# Patient Record
Sex: Male | Born: 1965 | Race: White | Hispanic: Yes | Marital: Married | Smoking: Former smoker
Health system: Southern US, Community
[De-identification: ages and names within clinical notes are randomized; demographics above are authoritative.]

## PROBLEM LIST (undated history)

## (undated) DIAGNOSIS — R51 Headache: Secondary | ICD-10-CM

## (undated) DIAGNOSIS — R42 Dizziness and giddiness: Secondary | ICD-10-CM

---

## 2008-07-01 ENCOUNTER — Emergency Department (HOSPITAL_COMMUNITY): Admission: EM | Admit: 2008-07-01 | Discharge: 2008-07-01 | Payer: Self-pay | Admitting: Emergency Medicine

## 2008-11-08 ENCOUNTER — Emergency Department (HOSPITAL_COMMUNITY): Admission: EM | Admit: 2008-11-08 | Discharge: 2008-11-08 | Payer: Self-pay | Admitting: Emergency Medicine

## 2009-02-18 ENCOUNTER — Emergency Department (HOSPITAL_COMMUNITY): Admission: EM | Admit: 2009-02-18 | Discharge: 2009-02-18 | Payer: Self-pay | Admitting: Emergency Medicine

## 2009-02-19 ENCOUNTER — Encounter (INDEPENDENT_AMBULATORY_CARE_PROVIDER_SITE_OTHER): Payer: Self-pay | Admitting: *Deleted

## 2009-03-14 ENCOUNTER — Encounter (INDEPENDENT_AMBULATORY_CARE_PROVIDER_SITE_OTHER): Payer: Self-pay | Admitting: *Deleted

## 2009-03-27 ENCOUNTER — Encounter (INDEPENDENT_AMBULATORY_CARE_PROVIDER_SITE_OTHER): Payer: Self-pay | Admitting: *Deleted

## 2010-05-17 ENCOUNTER — Ambulatory Visit: Payer: Self-pay | Admitting: Vascular Surgery

## 2010-05-17 ENCOUNTER — Inpatient Hospital Stay (HOSPITAL_COMMUNITY): Admission: AC | Admit: 2010-05-17 | Discharge: 2010-05-20 | Payer: Self-pay | Source: Home / Self Care

## 2010-12-30 ENCOUNTER — Emergency Department (HOSPITAL_COMMUNITY): Payer: Self-pay

## 2010-12-30 ENCOUNTER — Emergency Department (HOSPITAL_COMMUNITY)
Admission: EM | Admit: 2010-12-30 | Discharge: 2010-12-30 | Disposition: A | Discharge status: Home / Self Care | Payer: Self-pay | Attending: Emergency Medicine | Admitting: Emergency Medicine

## 2010-12-30 DIAGNOSIS — R51 Headache: Secondary | ICD-10-CM | POA: Insufficient documentation

## 2010-12-30 DIAGNOSIS — H538 Other visual disturbances: Secondary | ICD-10-CM | POA: Insufficient documentation

## 2011-01-11 LAB — CBC
HCT: 35.5 % — ABNORMAL LOW (ref 39.0–52.0)
Hemoglobin: 12.2 g/dL — ABNORMAL LOW (ref 13.0–17.0)
Hemoglobin: 12.4 g/dL — ABNORMAL LOW (ref 13.0–17.0)
Hemoglobin: 12.6 g/dL — ABNORMAL LOW (ref 13.0–17.0)
Hemoglobin: 13.1 g/dL (ref 13.0–17.0)
Hemoglobin: 15.3 g/dL (ref 13.0–17.0)
MCH: 31.8 pg (ref 26.0–34.0)
MCH: 32 pg (ref 26.0–34.0)
MCH: 32 pg (ref 26.0–34.0)
MCH: 32.2 pg (ref 26.0–34.0)
MCHC: 34.6 g/dL (ref 30.0–36.0)
MCHC: 34.9 g/dL (ref 30.0–36.0)
MCHC: 34.9 g/dL (ref 30.0–36.0)
MCHC: 35 g/dL (ref 30.0–36.0)
MCHC: 35.1 g/dL (ref 30.0–36.0)
MCHC: 35.2 g/dL (ref 30.0–36.0)
MCV: 91.6 fL (ref 78.0–100.0)
MCV: 92 fL (ref 78.0–100.0)
Platelets: 89 10*3/uL — ABNORMAL LOW (ref 150–400)
Platelets: 95 10*3/uL — ABNORMAL LOW (ref 150–400)
RBC: 3.82 MIL/uL — ABNORMAL LOW (ref 4.22–5.81)
RBC: 3.88 MIL/uL — ABNORMAL LOW (ref 4.22–5.81)
RBC: 4.77 MIL/uL (ref 4.22–5.81)
RDW: 13.3 % (ref 11.5–15.5)
RDW: 13.5 % (ref 11.5–15.5)
RDW: 13.7 % (ref 11.5–15.5)
WBC: 14.1 10*3/uL — ABNORMAL HIGH (ref 4.0–10.5)

## 2011-01-11 LAB — PROTIME-INR
INR: 1.05 (ref 0.00–1.49)
INR: 1.05 (ref 0.00–1.49)
Prothrombin Time: 13.6 seconds (ref 11.6–15.2)

## 2011-01-11 LAB — POCT I-STAT 7, (LYTES, BLD GAS, ICA,H+H)
Acid-base deficit: 7 mmol/L — ABNORMAL HIGH (ref 0.0–2.0)
Bicarbonate: 19.6 meq/L — ABNORMAL LOW (ref 20.0–24.0)
HCT: 41 % (ref 39.0–52.0)
O2 Saturation: 100 %

## 2011-01-11 LAB — CARDIAC PANEL(CRET KIN+CKTOT+MB+TROPI)
CK, MB: 8.8 ng/mL (ref 0.3–4.0)
CK, MB: 9.3 ng/mL (ref 0.3–4.0)
Relative Index: 0.4 (ref 0.0–2.5)
Relative Index: 0.7 (ref 0.0–2.5)
Total CK: 1559 U/L — ABNORMAL HIGH (ref 7–232)

## 2011-01-11 LAB — BASIC METABOLIC PANEL
BUN: 14 mg/dL (ref 6–23)
CO2: 23 mEq/L (ref 19–32)
CO2: 24 mEq/L (ref 19–32)
Calcium: 7.6 mg/dL — ABNORMAL LOW (ref 8.4–10.5)
Chloride: 104 mEq/L (ref 96–112)
Creatinine, Ser: 0.79 mg/dL (ref 0.4–1.5)
GFR calc Af Amer: >60 mL/min (ref >60)
GFR calc non Af Amer: >60 mL/min (ref >60)
Glucose, Bld: 124 mg/dL — ABNORMAL HIGH (ref 70–99)
Glucose, Bld: 95 mg/dL (ref 70–99)
Potassium: 3.4 mEq/L — ABNORMAL LOW (ref 3.5–5.1)
Sodium: 134 mEq/L — ABNORMAL LOW (ref 135–145)

## 2011-01-11 LAB — TYPE AND SCREEN
ABO/RH(D): A POS
Antibody Screen: NEGATIVE

## 2011-01-11 LAB — COMPREHENSIVE METABOLIC PANEL
AST: 30 U/L (ref 0–37)
Alkaline Phosphatase: 53 U/L (ref 39–117)
BUN: 17 mg/dL (ref 6–23)
CO2: 17 mEq/L — ABNORMAL LOW (ref 19–32)
Calcium: 8.3 mg/dL — ABNORMAL LOW (ref 8.4–10.5)
Chloride: 113 mEq/L — ABNORMAL HIGH (ref 96–112)
Creatinine, Ser: 1.03 mg/dL (ref 0.4–1.5)
GFR calc Af Amer: >60 mL/min (ref >60)
GFR calc non Af Amer: >60 mL/min (ref >60)
Potassium: 3.5 mEq/L (ref 3.5–5.1)
Sodium: 138 mEq/L (ref 135–145)

## 2011-01-11 LAB — POCT I-STAT, CHEM 8
Calcium, Ion: 1.05 mmol/L — ABNORMAL LOW (ref 1.12–1.32)
Creatinine, Ser: 0.9 mg/dL (ref 0.4–1.5)
Glucose, Bld: 114 mg/dL — ABNORMAL HIGH (ref 70–99)
HCT: 45 % (ref 39.0–52.0)
Sodium: 141 meq/L (ref 135–145)

## 2011-01-11 LAB — APTT: aPTT: 28 seconds (ref 24–37)

## 2011-01-11 LAB — ABO/RH: ABO/RH(D): A POS

## 2011-01-11 LAB — GLUCOSE, CAPILLARY: Glucose-Capillary: 149 mg/dL — ABNORMAL HIGH (ref 70–99)

## 2011-02-05 LAB — COMPREHENSIVE METABOLIC PANEL
Albumin: 4.3 g/dL (ref 3.5–5.2)
Alkaline Phosphatase: 58 U/L (ref 39–117)
BUN: 15 mg/dL (ref 6–23)
Calcium: 9.2 mg/dL (ref 8.4–10.5)
Creatinine, Ser: 0.79 mg/dL (ref 0.4–1.5)
Glucose, Bld: 119 mg/dL — ABNORMAL HIGH (ref 70–99)
Potassium: 3.9 mEq/L (ref 3.5–5.1)
Total Protein: 7.5 g/dL (ref 6.0–8.3)

## 2011-02-05 LAB — CBC
HCT: 51.2 % (ref 39.0–52.0)
Hemoglobin: 17.8 g/dL — ABNORMAL HIGH (ref 13.0–17.0)
MCHC: 34.8 g/dL (ref 30.0–36.0)
MCV: 91.7 fL (ref 78.0–100.0)
Platelets: 121 10*3/uL — ABNORMAL LOW (ref 150–400)
RDW: 13 % (ref 11.5–15.5)

## 2011-02-05 LAB — DIFFERENTIAL
Basophils Relative: 1 % (ref 0–1)
Lymphocytes Relative: 29 % (ref 12–46)
Lymphs Abs: 2.1 10*3/uL (ref 0.7–4.0)
Monocytes Absolute: 0.6 10*3/uL (ref 0.1–1.0)
Monocytes Relative: 8 % (ref 3–12)
Neutro Abs: 4.4 10*3/uL (ref 1.7–7.7)
Neutrophils Relative %: 61 % (ref 43–77)

## 2011-02-05 LAB — URINE MICROSCOPIC-ADD ON

## 2011-02-05 LAB — URINALYSIS, ROUTINE W REFLEX MICROSCOPIC
Glucose, UA: NEGATIVE mg/dL
Leukocytes, UA: NEGATIVE
Protein, ur: NEGATIVE mg/dL
Specific Gravity, Urine: 1.01 (ref 1.005–1.030)
Urobilinogen, UA: 0.2 mg/dL (ref 0.0–1.0)

## 2011-02-05 LAB — AMYLASE: Amylase: 70 U/L (ref 27–131)

## 2011-02-10 LAB — CBC
Hemoglobin: 16.9 g/dL (ref 13.0–17.0)
MCHC: 34.2 g/dL (ref 30.0–36.0)
RBC: 5.47 MIL/uL (ref 4.22–5.81)
RDW: 13 % (ref 11.5–15.5)

## 2011-02-10 LAB — POCT CARDIAC MARKERS: Troponin i, poc: <0.05 ng/mL (ref 0.00–0.09)

## 2011-02-10 LAB — COMPREHENSIVE METABOLIC PANEL
ALT: 33 U/L (ref 0–53)
AST: 26 U/L (ref 0–37)
Alkaline Phosphatase: 50 U/L (ref 39–117)
Calcium: 9.2 mg/dL (ref 8.4–10.5)
GFR calc Af Amer: >60 mL/min (ref >60)
Potassium: 3.8 mEq/L (ref 3.5–5.1)
Sodium: 137 mEq/L (ref 135–145)
Total Protein: 7.1 g/dL (ref 6.0–8.3)

## 2011-02-10 LAB — DIFFERENTIAL
Basophils Relative: 0 % (ref 0–1)
Eosinophils Absolute: 0.1 10*3/uL (ref 0.0–0.7)
Eosinophils Relative: 2 % (ref 0–5)
Lymphs Abs: 2.2 10*3/uL (ref 0.7–4.0)
Monocytes Absolute: 0.6 10*3/uL (ref 0.1–1.0)
Monocytes Relative: 8 % (ref 3–12)

## 2011-10-16 ENCOUNTER — Encounter: Payer: Self-pay | Admitting: Emergency Medicine

## 2011-10-16 ENCOUNTER — Emergency Department (HOSPITAL_COMMUNITY): Payer: Self-pay

## 2011-10-16 ENCOUNTER — Emergency Department (HOSPITAL_COMMUNITY)
Admission: EM | Admit: 2011-10-16 | Discharge: 2011-10-16 | Disposition: A | Discharge status: Home / Self Care | Payer: Self-pay | Attending: Emergency Medicine | Admitting: Emergency Medicine

## 2011-10-16 DIAGNOSIS — Z87891 Personal history of nicotine dependence: Secondary | ICD-10-CM | POA: Insufficient documentation

## 2011-10-16 DIAGNOSIS — J111 Influenza due to unidentified influenza virus with other respiratory manifestations: Secondary | ICD-10-CM | POA: Insufficient documentation

## 2011-10-16 LAB — GLUCOSE, CAPILLARY: Glucose-Capillary: 110 mg/dL — ABNORMAL HIGH (ref 70–99)

## 2011-10-16 LAB — RAPID STREP SCREEN (MED CTR MEBANE ONLY): Streptococcus, Group A Screen (Direct): NEGATIVE

## 2011-10-16 MED ORDER — GUAIFENESIN-CODEINE 100-10 MG/5ML PO SYRP: ORAL_SOLUTION | ORAL | Status: DC

## 2011-10-16 MED ORDER — IBUPROFEN 100 MG/5ML PO SUSP
800.0000 mg | Freq: Once | ORAL | Status: DC
  Filled 2011-10-16: qty 40

## 2011-10-16 MED ORDER — IBUPROFEN 800 MG PO TABS
ORAL_TABLET | ORAL | Status: AC
  Administered 2011-10-16: 800 mg
  Filled 2011-10-16: qty 1

## 2011-10-16 NOTE — ED Notes (Signed)
Alert, Nad.  Body aches, fever.

## 2011-10-16 NOTE — ED Provider Notes (Signed)
History     CSN: [id number]  Arrival date & time 10/16/11  1723   First MD Initiated Contact with Patient 10/16/11 1754      Chief Complaint  Patient presents with  . Generalized Body Aches  . Fever    (Consider location/radiation/quality/duration/timing/severity/associated sxs/prior treatment) Patient is a 44 y.o. male presenting with fever. No language interpreter was used.  Fever Primary symptoms of the febrile illness include fever and cough. Primary symptoms do not include wheezing, nausea, vomiting, diarrhea or rash. The current episode started 3 to 5 days ago. This is a new problem. The problem has not changed since onset.Primary symptoms comment: sore throat    History reviewed. No pertinent past medical history.  History reviewed. No pertinent past surgical history.  History reviewed. No pertinent family history.  History  Substance Use Topics  . Smoking status: Former Smoker -- 0.0 packs/day    Types: Cigarettes    Quit date: 10/08/2011  . Smokeless tobacco: Not on file  . Alcohol Use: No      Review of Systems  Constitutional: Positive for fever and chills.  HENT: Positive for sore throat.   Respiratory: Positive for cough. Negative for wheezing.   Gastrointestinal: Negative for nausea, vomiting and diarrhea.  Skin: Negative for rash.  All other systems reviewed and are negative.    Allergies  Review of patient's allergies indicates no known allergies.  Home Medications  No current outpatient prescriptions on file.  BP 149/77  Pulse 69  Temp(Src) 99.4 F (37.4 C) (Oral)  Resp 18  Ht 5\' 3"  (1.6 m)  Wt 201 lb 1.6 oz (91.218 kg)  BMI 35.62 kg/m2  SpO2 100%  Physical Exam  Nursing note and vitals reviewed. Constitutional: He is oriented to person, place, and time. He appears well-developed and well-nourished.  HENT:  Head: Normocephalic and atraumatic. No trismus in the jaw.  Mouth/Throat: Uvula is midline and mucous membranes are normal.  No uvula swelling. Posterior oropharyngeal erythema present. No oropharyngeal exudate, posterior oropharyngeal edema or tonsillar abscesses.  Eyes: EOM are normal.  Neck: Normal range of motion. Neck supple. No JVD present. No tracheal deviation present. No thyromegaly present.  Cardiovascular: Normal rate, regular rhythm, normal heart sounds and intact distal pulses.  Exam reveals no gallop and no friction rub.   No murmur heard. Pulmonary/Chest: Effort normal and breath sounds normal. No accessory muscle usage or stridor. Not tachypneic. No respiratory distress. He has no decreased breath sounds. He has no wheezes. He has no rhonchi. He has no rales. He exhibits no tenderness.  Abdominal: Soft. He exhibits no distension. There is no tenderness.  Musculoskeletal: Normal range of motion.  Lymphadenopathy:    He has no cervical adenopathy.  Neurological: He is alert and oriented to person, place, and time.  Skin: Skin is warm and dry.  Psychiatric: He has a normal mood and affect. Judgment normal.    ED Course  Procedures (including critical care time)  Labs Reviewed  GLUCOSE, CAPILLARY - Abnormal; Notable for the following:    Glucose-Capillary 110 (*)    All other components within normal limits  RAPID STREP SCREEN   Dg Chest 2 View  10/16/2011  *RADIOLOGY REPORT*  Clinical Data: Cough, congestion, fever.  CHEST - 2 VIEW  Comparison: None  Findings: There is peribronchial thickening.  Low volumes with bibasilar atelectasis.  Heart is borderline in size.  No effusions or acute bony abnormality.  IMPRESSION: Low lung volumes, bronchitis and bibasilar atelectasis.  Original  Report Authenticated By: Cyndie Chime, M.D.     No diagnosis found.    MDM  Pt does not really fit criteria for tx of flu with tamiflu.  No strep or pneumonia evidence on testing.  Will tx flu symptomatically.   Medical screening examination/treatment/procedure(s) were performed by non-physician practitioner  and as supervising physician I was immediately available for consultation/collaboration. Osvaldo Human, M.D.      Worthy Rancher, PA 10/16/11 1943  Carleene Cooper III, MD 10/16/11 [phone]

## 2011-10-16 NOTE — ED Notes (Signed)
Patient c/o generalized aching and fevers since Monday. Patient reports taking Advil and over the counter medication with little relief.

## 2011-10-16 NOTE — ED Notes (Signed)
Patient requesting to have blood sugar checked.   ?

## 2011-10-17 ENCOUNTER — Emergency Department (HOSPITAL_COMMUNITY)
Admission: EM | Admit: 2011-10-17 | Discharge: 2011-10-17 | Disposition: A | Discharge status: Home / Self Care | Payer: Self-pay | Attending: Emergency Medicine | Admitting: Emergency Medicine

## 2011-10-17 ENCOUNTER — Encounter (HOSPITAL_COMMUNITY): Payer: Self-pay | Admitting: Emergency Medicine

## 2011-10-17 DIAGNOSIS — Z87891 Personal history of nicotine dependence: Secondary | ICD-10-CM | POA: Insufficient documentation

## 2011-10-17 DIAGNOSIS — R51 Headache: Secondary | ICD-10-CM | POA: Insufficient documentation

## 2011-10-17 DIAGNOSIS — K5289 Other specified noninfective gastroenteritis and colitis: Secondary | ICD-10-CM | POA: Insufficient documentation

## 2011-10-17 DIAGNOSIS — R059 Cough, unspecified: Secondary | ICD-10-CM | POA: Insufficient documentation

## 2011-10-17 DIAGNOSIS — R1110 Vomiting, unspecified: Secondary | ICD-10-CM

## 2011-10-17 DIAGNOSIS — R05 Cough: Secondary | ICD-10-CM | POA: Insufficient documentation

## 2011-10-17 DIAGNOSIS — K529 Noninfective gastroenteritis and colitis, unspecified: Secondary | ICD-10-CM

## 2011-10-17 DIAGNOSIS — R112 Nausea with vomiting, unspecified: Secondary | ICD-10-CM | POA: Insufficient documentation

## 2011-10-17 LAB — BASIC METABOLIC PANEL
Chloride: 101 mEq/L (ref 96–112)
GFR calc Af Amer: >90 mL/min (ref >90)
GFR calc non Af Amer: >90 mL/min (ref >90)
Glucose, Bld: 114 mg/dL — ABNORMAL HIGH (ref 70–99)
Potassium: 3.8 mEq/L (ref 3.5–5.1)
Sodium: 135 mEq/L (ref 135–145)

## 2011-10-17 LAB — DIFFERENTIAL
Eosinophils Absolute: 0.1 10*3/uL (ref 0.0–0.7)
Lymphocytes Relative: 17 % (ref 12–46)
Lymphs Abs: 0.8 10*3/uL (ref 0.7–4.0)
Neutro Abs: 3.2 10*3/uL (ref 1.7–7.7)
Neutrophils Relative %: 70 % (ref 43–77)

## 2011-10-17 LAB — CBC
MCH: 29.7 pg (ref 26.0–34.0)
Platelets: 91 10*3/uL — ABNORMAL LOW (ref 150–400)
RBC: 4.99 MIL/uL (ref 4.22–5.81)
WBC: 4.6 10*3/uL (ref 4.0–10.5)

## 2011-10-17 MED ORDER — ONDANSETRON HCL 4 MG/2ML IJ SOLN
4.0000 mg | Freq: Once | INTRAMUSCULAR | Status: AC
  Administered 2011-10-17: 4 mg via INTRAVENOUS
  Filled 2011-10-17: qty 2

## 2011-10-17 MED ORDER — SODIUM CHLORIDE 0.9 % IV SOLN
Freq: Once | INTRAVENOUS | Status: AC
  Administered 2011-10-17: 21:00:00 via INTRAVENOUS

## 2011-10-17 MED ORDER — PROMETHAZINE HCL 25 MG PO TABS: 25.0000 mg | ORAL_TABLET | Freq: Four times a day (QID) | ORAL | Status: AC | PRN

## 2011-10-17 MED ORDER — KETOROLAC TROMETHAMINE 30 MG/ML IJ SOLN
30.0000 mg | Freq: Once | INTRAMUSCULAR | Status: AC
  Administered 2011-10-17: 30 mg via INTRAVENOUS
  Filled 2011-10-17: qty 1

## 2011-10-17 NOTE — ED Provider Notes (Signed)
History   Scribed for Geoffery Lyons, MD, the patient was seen in room  . This chart was scribed by Ellie Lunch.    CSN: [id number]  Arrival date & time 10/17/11  4098   First MD Initiated Contact with Patient 10/17/11 2010      Chief Complaint  Patient presents with  . Headache  . Nausea  . Emesis    (Consider location/radiation/quality/duration/timing/severity/associated sxs/prior treatment) HPI Scott Byrd is a 45 y.o. male who presents to the Emergency Department complaining of emesis onset today. Pt states vomiting, cough and headache as associated symptoms. Pt was here last night with fever, cough, and congestion. Medication administered in hospital last night has shown some improvement. Pt last urinated ~30 mins ago. Pt denies abdominal pain, diarrhea and blood in stools, blood in emesis, or fever. No pertinent medical Hx.   History reviewed. No pertinent past medical history.  History reviewed. No pertinent past surgical history.  History reviewed. No pertinent family history.  History  Substance Use Topics  . Smoking status: Former Smoker -- 0.0 packs/day    Types: Cigarettes    Quit date: 10/08/2011  . Smokeless tobacco: Not on file  . Alcohol Use: No      Review of Systems 10 Systems reviewed and are negative for acute change except as noted in the HPI.  Allergies  Review of patient's allergies indicates no known allergies.  Home Medications   Current Outpatient Rx  Name Route Sig Dispense Refill  . GUAIFENESIN-CODEINE 100-10 MG/5ML PO SYRP  10 ml q 4-6 hrs prn cough.  (please label in Spanish) 240 mL 0  . IBUPROFEN 200 MG PO TABS Oral Take 600 mg by mouth as needed. For pain       BP 152/82  Pulse 64  Temp(Src) 98.5 F (36.9 C) (Oral)  Resp 18  Wt 210 lb (95.255 kg)  SpO2 98%  Physical Exam  Nursing note and vitals reviewed. Constitutional: He is oriented to person, place, and time. He appears well-developed and well-nourished. No  distress.  HENT:  Right Ear: External ear normal.  Left Ear: External ear normal.  Mouth/Throat: Oropharynx is clear and moist.  Eyes: Conjunctivae are normal. Pupils are equal, round, and reactive to light.  Neck: Neck supple. No JVD present.  Cardiovascular: Normal rate, regular rhythm and normal heart sounds.   Pulmonary/Chest: Effort normal and breath sounds normal.  Abdominal: Soft. There is no tenderness.  Musculoskeletal: Normal range of motion.  Neurological: He is alert and oriented to person, place, and time.  Skin: Skin is warm and dry.    ED Course  Procedures (including critical care time) DIAGNOSTIC STUDIES: Oxygen Saturation is 98% on RA, nml by my interpretation.    COORDINATION OF CARE:  Labs Reviewed  CBC  DIFFERENTIAL  BASIC METABOLIC PANEL   ED MEDICATIONS Medications  0.9 %  sodium chloride infusion   ketorolac (TORADOL) 30 MG/ML injection 30 mg   ondansetron (ZOFRAN) injection 4 mg     No diagnosis found.    MDM  Labs look fine.  Illness seems viral in nature.  Will treat with phenergan, time, fluids.  To return prn for any problems.  I personally performed the services described in this documentation, which was scribed in my presence. The recorded information has been reviewed and considered.         Geoffery Lyons, MD 10/17/11 2142

## 2011-10-17 NOTE — ED Notes (Signed)
Pt c/o headache, vomiting, and nausea since yesterday.

## 2011-11-11 NOTE — ED Notes (Signed)
11/11/2011 cheratussin ac syrup left in ed on 10/16/11 discarded.

## 2012-03-29 ENCOUNTER — Encounter (HOSPITAL_COMMUNITY): Payer: Self-pay | Admitting: *Deleted

## 2012-03-29 ENCOUNTER — Emergency Department (HOSPITAL_COMMUNITY)
Admission: EM | Admit: 2012-03-29 | Discharge: 2012-03-29 | Disposition: A | Discharge status: Home / Self Care | Payer: Self-pay | Attending: Emergency Medicine | Admitting: Emergency Medicine

## 2012-03-29 DIAGNOSIS — R300 Dysuria: Secondary | ICD-10-CM | POA: Insufficient documentation

## 2012-03-29 DIAGNOSIS — F17200 Nicotine dependence, unspecified, uncomplicated: Secondary | ICD-10-CM | POA: Insufficient documentation

## 2012-03-29 DIAGNOSIS — Z202 Contact with and (suspected) exposure to infections with a predominantly sexual mode of transmission: Secondary | ICD-10-CM

## 2012-03-29 LAB — URINALYSIS, ROUTINE W REFLEX MICROSCOPIC
Glucose, UA: NEGATIVE mg/dL
Leukocytes, UA: NEGATIVE
pH: 6 (ref 5.0–8.0)

## 2012-03-29 LAB — URINE MICROSCOPIC-ADD ON

## 2012-03-29 MED ORDER — AZITHROMYCIN 250 MG PO TABS
1000.0000 mg | ORAL_TABLET | Freq: Once | ORAL | Status: AC
  Administered 2012-03-29: 1000 mg via ORAL
  Filled 2012-03-29: qty 4

## 2012-03-29 MED ORDER — LIDOCAINE HCL (PF) 1 % IJ SOLN
INTRAMUSCULAR | Status: AC
  Administered 2012-03-29: 2.1 mL
  Filled 2012-03-29: qty 5

## 2012-03-29 MED ORDER — CEFTRIAXONE SODIUM 1 G IJ SOLR
500.0000 mg | Freq: Once | INTRAMUSCULAR | Status: AC
  Administered 2012-03-29: 500 mg via INTRAMUSCULAR
  Filled 2012-03-29: qty 10

## 2012-03-29 NOTE — ED Notes (Signed)
Pt with no reaction to injection. Discharge instructions reviewed. Pt verbalized understanding.

## 2012-03-29 NOTE — ED Notes (Signed)
Dysuria x 2 days,No NVD, no fever.

## 2012-03-29 NOTE — ED Provider Notes (Signed)
History     CSN: [id number]  Arrival date & time 03/29/12  2001   First MD Initiated Contact with Patient 03/29/12 2032      Chief Complaint  Patient presents with  . Dysuria    (Consider location/radiation/quality/duration/timing/severity/associated sxs/prior treatment) HPI Comments: Patient c/o burning with urination for 2 days. occasional penile discharge.  States he noticed symptoms after having unprotected intercourse.  He denies abd pain, fever, back pain or vomiting.   Patient is a 46 y.o. male presenting with dysuria. The history is provided by the patient.  Dysuria  This is a new problem. The current episode started 2 days ago. The problem occurs every urination. The problem has not changed since onset.The quality of the pain is described as burning. The pain is mild. There has been no fever. He is sexually active. There is no history of pyelonephritis. Associated symptoms include discharge. Pertinent negatives include no chills, no sweats, no nausea, no vomiting, no frequency, no hematuria, no hesitancy, no urgency and no flank pain. He has tried nothing for the symptoms. His past medical history does not include kidney stones or recurrent UTIs.    History reviewed. No pertinent past medical history.  History reviewed. No pertinent past surgical history.  History reviewed. No pertinent family history.  History  Substance Use Topics  . Smoking status: Current Everyday Smoker -- 0.0 packs/day    Types: Cigarettes    Last Attempt to Quit: 10/08/2011  . Smokeless tobacco: Not on file  . Alcohol Use: Yes      Review of Systems  Constitutional: Negative for fever and chills.  HENT: Negative for neck pain and neck stiffness.   Respiratory: Negative for chest tightness and shortness of breath.   Gastrointestinal: Negative for nausea, vomiting and abdominal pain.  Genitourinary: Positive for dysuria and discharge. Negative for hesitancy, urgency, frequency, hematuria,  flank pain, decreased urine volume, penile swelling, scrotal swelling, difficulty urinating and testicular pain.  Musculoskeletal: Negative for back pain.  Skin: Negative for color change and wound.  All other systems reviewed and are negative.    Allergies  Review of patient's allergies indicates no known allergies.  Home Medications   Current Outpatient Rx  Name Route Sig Dispense Refill  . AMPICILLIN 500 MG PO CAPS Oral Take 500 mg by mouth every 6 (six) hours.    . IBUPROFEN 200 MG PO TABS Oral Take 600 mg by mouth as needed. For pain     . PHENAZOPYRIDINE HCL 97.5 MG PO TABS Oral Take 1 tablet by mouth 3 (three) times daily as needed.      BP 150/84  Pulse 72  Temp(Src) 98.2 F (36.8 C) (Oral)  Resp 20  Ht 5\' 6"  (1.676 m)  Wt 198 lb (89.812 kg)  BMI 31.96 kg/m2  SpO2 97%  Physical Exam  Nursing note and vitals reviewed. Constitutional: He is oriented to person, place, and time. He appears well-developed and well-nourished. No distress.  HENT:  Head: Normocephalic and atraumatic.  Mouth/Throat: Oropharynx is clear and moist.  Neck: Normal range of motion. Neck supple.  Cardiovascular: Normal rate, regular rhythm, normal heart sounds and intact distal pulses.   No murmur heard. Pulmonary/Chest: Effort normal and breath sounds normal. No respiratory distress. He exhibits no tenderness.  Abdominal: Soft. He exhibits no distension. There is no tenderness. There is no rebound.  Genitourinary: Cremasteric reflex is present. Uncircumcised. No phimosis, paraphimosis, hypospadias, penile erythema or penile tenderness. Discharge found.  Musculoskeletal: Normal range of motion.  He exhibits no edema.  Lymphadenopathy:    He has no cervical adenopathy.  Neurological: He is alert and oriented to person, place, and time. He exhibits normal muscle tone. Coordination normal.  Skin: Skin is warm and dry.    ED Course  Procedures (including critical care time)  Results for orders  placed during the hospital encounter of 03/29/12  URINALYSIS, ROUTINE W REFLEX MICROSCOPIC      Component Value Range   Color, Urine AMBER (*) YELLOW    APPearance CLEAR  CLEAR    Specific Gravity, Urine 1.010  1.005 - 1.030    pH 6.0  5.0 - 8.0    Glucose, UA NEGATIVE  NEGATIVE (mg/dL)   Hgb urine dipstick TRACE (*) NEGATIVE    Bilirubin Urine NEGATIVE  NEGATIVE    Ketones, ur NEGATIVE  NEGATIVE (mg/dL)   Protein, ur NEGATIVE  NEGATIVE (mg/dL)   Urobilinogen, UA 0.2  0.0 - 1.0 (mg/dL)   Nitrite NEGATIVE  NEGATIVE    Leukocytes, UA NEGATIVE  NEGATIVE   URINE MICROSCOPIC-ADD ON      Component Value Range   Squamous Epithelial / LPF RARE  RARE    WBC, UA 0-2  <3 (WBC/hpf)   RBC / HPF 0-2  <3 (RBC/hpf)   Bacteria, UA RARE  RARE       GC and Chlamydia cultures are pending   MDM     Patient is alert, vital signs are stable. Abdomen is nontender Reports burning with urination and after episode of unprotected intercourse. I will treat with Rocephin and Zithromax.  Patient / Family / Caregiver understand and agree with initial ED impression and plan with expectations set for ED visit. Pt stable in ED with no significant deterioration in condition. Pt feels improved after observation and/or treatment in ED.    The patient appears reasonably screened and/or stabilized for discharge and I doubt any other medical condition or other [facility] requiring further screening, evaluation, or treatment in the ED at this time prior to discharge.         Haruka Kowaleski L. Grace Haggart, [place] 04/02/12 1715

## 2012-03-31 LAB — GC/CHLAMYDIA PROBE AMP, GENITAL: GC Probe Amp, Genital: NEGATIVE

## 2012-04-02 NOTE — ED Provider Notes (Signed)
Medical screening examination/treatment/procedure(s) were performed by non-physician practitioner and as supervising physician I was immediately available for consultation/collaboration.   Glynn Octave, MD 04/02/12 [phone]

## 2012-06-16 ENCOUNTER — Emergency Department (HOSPITAL_COMMUNITY)
Admission: EM | Admit: 2012-06-16 | Discharge: 2012-06-16 | Disposition: A | Discharge status: Home / Self Care | Payer: Self-pay | Attending: Emergency Medicine | Admitting: Emergency Medicine

## 2012-06-16 ENCOUNTER — Encounter (HOSPITAL_COMMUNITY): Payer: Self-pay | Admitting: *Deleted

## 2012-06-16 ENCOUNTER — Emergency Department (HOSPITAL_COMMUNITY): Payer: Self-pay

## 2012-06-16 DIAGNOSIS — Z79899 Other long term (current) drug therapy: Secondary | ICD-10-CM | POA: Insufficient documentation

## 2012-06-16 DIAGNOSIS — F17200 Nicotine dependence, unspecified, uncomplicated: Secondary | ICD-10-CM | POA: Insufficient documentation

## 2012-06-16 DIAGNOSIS — H81399 Other peripheral vertigo, unspecified ear: Secondary | ICD-10-CM

## 2012-06-16 DIAGNOSIS — H538 Other visual disturbances: Secondary | ICD-10-CM | POA: Insufficient documentation

## 2012-06-16 DIAGNOSIS — R42 Dizziness and giddiness: Secondary | ICD-10-CM | POA: Insufficient documentation

## 2012-06-16 DIAGNOSIS — E119 Type 2 diabetes mellitus without complications: Secondary | ICD-10-CM | POA: Insufficient documentation

## 2012-06-16 HISTORY — DX: Headache: R51

## 2012-06-16 HISTORY — DX: Dizziness and giddiness: R42

## 2012-06-16 LAB — POCT I-STAT, CHEM 8
BUN: 24 mg/dL — ABNORMAL HIGH (ref 6–23)
Calcium, Ion: 1.21 mmol/L (ref 1.12–1.23)
Creatinine, Ser: 0.9 mg/dL (ref 0.50–1.35)
Sodium: 142 mEq/L (ref 135–145)
TCO2: 24 mmol/L (ref 0–100)

## 2012-06-16 MED ORDER — MECLIZINE HCL 12.5 MG PO TABS
50.0000 mg | ORAL_TABLET | Freq: Once | ORAL | Status: AC
  Administered 2012-06-16: 50 mg via ORAL
  Filled 2012-06-16: qty 4

## 2012-06-16 MED ORDER — MECLIZINE HCL 25 MG PO TABS: 25.0000 mg | ORAL_TABLET | Freq: Three times a day (TID) | ORAL | Status: AC | PRN

## 2012-06-16 NOTE — ED Provider Notes (Signed)
History     CSN: [id number]  Arrival date & time 06/16/12  1728   First MD Initiated Contact with Patient 06/16/12 1848      Chief Complaint  Patient presents with  . Blurred Vision     HPI Pt was seen at 1900.  Per pt, c/o gradual onset and persistence of multiple intermittent episodes of "dizziness" since this morning.  Pt describes his dizziness as "it feels like I'm drunk."  Symptoms worsen with movement of his body, improve with rest/laying still.  Have been associated with "blurry" vision.  Endorses he has a hx of these recurrent symptoms for the past several years, and cannot recall the name of the medicine he was taking.  Denies CP/palpitations, no SOB/cough, no abd pain, no N/V/D, no fevers, no neck pain.  Denies headache, no eye pain, no facial droop, no slurred speech, no dysphagia, no tingling/numbness in extremities, no focal motor weakness.      Past Medical History  Diagnosis Date  . Diabetes mellitus   . Headache   . Vertigo     History reviewed. No pertinent past surgical history.   History  Substance Use Topics  . Smoking status: Current Everyday Smoker -- 0.0 packs/day    Types: Cigarettes    Last Attempt to Quit: 10/08/2011  . Smokeless tobacco: Not on file  . Alcohol Use: Yes      Review of Systems ROS: Statement: All systems negative except as marked or noted in the HPI; Constitutional: Negative for fever and chills. ; ; Eyes: Negative for eye pain, redness and discharge. ; ; ENMT: Negative for ear pain, hoarseness, nasal congestion, sinus pressure and sore throat. ; ; Cardiovascular: Negative for chest pain, palpitations, diaphoresis, dyspnea and peripheral edema. ; ; Respiratory: Negative for cough, wheezing and stridor. ; ; Gastrointestinal: Negative for nausea, vomiting, diarrhea, abdominal pain, blood in stool, hematemesis, jaundice and rectal bleeding. . ; ; Genitourinary: Negative for dysuria, flank pain and hematuria. ; ; Musculoskeletal:  Negative for back pain and neck pain. Negative for swelling and trauma.; ; Skin: Negative for pruritus, rash, abrasions, blisters, bruising and skin lesion.; ; Neuro: +vertigo. Negative for headache, lightheadedness and neck stiffness. Negative for weakness, altered level of consciousness , altered mental status, extremity weakness, paresthesias, involuntary movement, seizure and syncope.       Allergies  Review of patient's allergies indicates no known allergies.  Home Medications   Current Outpatient Rx  Name Route Sig Dispense Refill  . AMPICILLIN 500 MG PO CAPS Oral Take 500 mg by mouth every 6 (six) hours.    . IBUPROFEN 200 MG PO TABS Oral Take 600 mg by mouth as needed. For pain     . PHENAZOPYRIDINE HCL 97.5 MG PO TABS Oral Take 1 tablet by mouth 3 (three) times daily as needed.      BP 149/83  Pulse 60  Temp 98.3 F (36.8 C) (Oral)  Resp 20  Ht 5\' 5"  (1.651 m)  Wt 164 lb (74.39 kg)  BMI 27.29 kg/m2  SpO2 100%  Physical Exam 1905: Physical examination:  Nursing notes reviewed; Vital signs and O2 SAT reviewed;  Constitutional: Well developed, Well nourished, Well hydrated, In no acute distress; Head:  Normocephalic, atraumatic; Eyes: EOMI, PERRL, No scleral icterus; ENMT: TM's clear bilat. +edemetous nasal turbinates bilat with clear rhinorrhea. Mouth and pharynx normal, Mucous membranes moist; Neck: Supple, Full range of motion, No lymphadenopathy; Cardiovascular: Regular rate and rhythm, No murmur, rub, or gallop; Respiratory: Breath  sounds clear & equal bilaterally, No rales, rhonchi, wheezes.  Speaking full sentences with ease, Normal respiratory effort/excursion; Chest: Nontender, Movement normal; Abdomen: Soft, Nontender, Nondistended, Normal bowel sounds; Genitourinary: No CVA tenderness; Extremities: Pulses normal, No tenderness, No edema, No calf edema or asymmetry.; Neuro: AA&Ox3, Major CN grossly intact.  Strength 5/5 equal bilat UE's and LE's.  DTR 2/4 equal bilat UE's  and LE's.  No gross sensory deficits.  Normal cerebellar testing bilat UE's (finger-nose) and LE's (heel-shin).  Speech clear.  No facial droop.  +left horizontal gaze fatigable nystagmus which reproduces pt's symptoms.;; Skin: Color normal, Warm, Dry, no rash.     ED Course  Procedures   MDM  MDM Reviewed: previous chart, nursing note and vitals Interpretation: labs and CT scan   Results for orders placed during the hospital encounter of 06/16/12  GLUCOSE, CAPILLARY      Component Value Range   Glucose-Capillary 96  70 - 99 mg/dL  POCT I-STAT, CHEM 8      Component Value Range   Sodium 142  135 - 145 mEq/L   Potassium 3.6  3.5 - 5.1 mEq/L   Chloride 107  96 - 112 mEq/L   BUN 24 (*) 6 - 23 mg/dL   Creatinine, Ser 9.14  0.50 - 1.35 mg/dL   Glucose, Bld 782 (*) 70 - 99 mg/dL   Calcium, Ion 9.56  2.13 - 1.23 mmol/L   TCO2 24  0 - 100 mmol/L   Hemoglobin 16.0  13.0 - 17.0 g/dL   HCT 08.6  57.8 - 46.9 %   Ct Head Wo Contrast 06/16/2012  *RADIOLOGY REPORT*  Clinical Data: Headache  CT HEAD WITHOUT CONTRAST  Technique:  Contiguous axial images were obtained from the base of the skull through the vertex without contrast.  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  Ventricles are normal in configuration and size. No midline shift. No acute hemorrhage, acute infarction, or mass lesion is identified.  No midline shift.  No ventriculomegaly.  No skull fracture.  Mild ethmoid and right maxillary mucoperiosteal thickening.  IMPRESSION: No acute intracranial finding.  Mild sinusitis.   Original Report Authenticated By: Harrel Lemon, M.D.      2015:  Pt states he feels "better" after meds and wants to go home now.  Neuro exam unchanged. Has been using his handheld electronic device without difficulty and watching TV while in the ED.  VS 20/20 OU, 20/20 OS, 20/20, OD.  Not orthostatic.  Dx testing d/w pt.  Questions answered.  Verb  understanding, agreeable to d/c home with outpt f/u.         Laray Anger, DO 06/17/12 2055

## 2012-06-16 NOTE — ED Notes (Signed)
Pt c/o blurred vision and dizziness since this am. Pt also c/o being stressed. Pt able to smile, squint eyes and stick out tongue with symmetry.

## 2012-06-16 NOTE — ED Notes (Signed)
Instructions/prescriptions reviewed and f/u information provided. Verbalizes understanding.  A&ox4; in no distress.  Denies dizziness, blurred vision.

## 2012-10-28 ENCOUNTER — Encounter (HOSPITAL_COMMUNITY): Payer: Self-pay | Admitting: *Deleted

## 2012-10-28 ENCOUNTER — Emergency Department (HOSPITAL_COMMUNITY)
Admission: EM | Admit: 2012-10-28 | Discharge: 2012-10-28 | Disposition: A | Discharge status: Home / Self Care | Payer: Self-pay | Attending: Emergency Medicine | Admitting: Emergency Medicine

## 2012-10-28 DIAGNOSIS — R300 Dysuria: Secondary | ICD-10-CM | POA: Insufficient documentation

## 2012-10-28 DIAGNOSIS — F17200 Nicotine dependence, unspecified, uncomplicated: Secondary | ICD-10-CM | POA: Insufficient documentation

## 2012-10-28 DIAGNOSIS — E119 Type 2 diabetes mellitus without complications: Secondary | ICD-10-CM | POA: Insufficient documentation

## 2012-10-28 DIAGNOSIS — Z8679 Personal history of other diseases of the circulatory system: Secondary | ICD-10-CM | POA: Insufficient documentation

## 2012-10-28 LAB — URINALYSIS, ROUTINE W REFLEX MICROSCOPIC
Glucose, UA: NEGATIVE mg/dL
Protein, ur: NEGATIVE mg/dL
pH: 6 (ref 5.0–8.0)

## 2012-10-28 LAB — URINE MICROSCOPIC-ADD ON

## 2012-10-28 LAB — GLUCOSE, CAPILLARY: Glucose-Capillary: 84 mg/dL (ref 70–99)

## 2012-10-28 MED ORDER — CIPROFLOXACIN HCL 500 MG PO TABS: 500.0000 mg | ORAL_TABLET | Freq: Two times a day (BID) | ORAL | Status: DC

## 2012-10-28 NOTE — ED Provider Notes (Signed)
History     CSN: [id number]  Arrival date & time 10/28/12  1716   First MD Initiated Contact with Patient 10/28/12 1847      Chief Complaint  Patient presents with  . Dysuria    (Consider location/radiation/quality/duration/timing/severity/associated sxs/prior treatment) Patient is a 47 y.o. male presenting with dysuria. The history is provided by the patient (the pt complains of dysuria).  Dysuria  This is a new problem. The current episode started more than 2 days ago. The problem occurs every urination. The problem has not changed since onset.The quality of the pain is described as burning. The pain is at a severity of 4/10. The pain is mild. There has been no fever. Pertinent negatives include no frequency and no hematuria.    Past Medical History  Diagnosis Date  . Diabetes mellitus   . Headache   . Vertigo     History reviewed. No pertinent past surgical history.  History reviewed. No pertinent family history.  History  Substance Use Topics  . Smoking status: Current Every Day Smoker -- 0.0 packs/day    Types: Cigarettes    Last Attempt to Quit: 10/08/2011  . Smokeless tobacco: Not on file  . Alcohol Use: Yes      Review of Systems  Constitutional: Negative for fatigue.  HENT: Negative for congestion, sinus pressure and ear discharge.   Eyes: Negative for discharge.  Respiratory: Negative for cough.   Cardiovascular: Negative for chest pain.  Gastrointestinal: Negative for abdominal pain and diarrhea.  Genitourinary: Positive for dysuria. Negative for frequency and hematuria.  Musculoskeletal: Negative for back pain.  Skin: Negative for rash.  Neurological: Negative for seizures and headaches.  Hematological: Negative.   Psychiatric/Behavioral: Negative for hallucinations.    Allergies  Review of patient's allergies indicates no known allergies.  Home Medications   Current Outpatient Rx  Name  Route  Sig  Dispense  Refill  . IBUPROFEN 200 MG PO  TABS   Oral   Take 600 mg by mouth as needed. For pain          . CIPROFLOXACIN HCL 500 MG PO TABS   Oral   Take 1 tablet (500 mg total) by mouth 2 (two) times daily.   28 tablet   0     BP 143/82  Pulse 68  Temp 98.1 F (36.7 C) (Oral)  Resp 21  Ht 5\' 2"  (1.575 m)  Wt 202 lb (91.627 kg)  BMI 36.95 kg/m2  SpO2 100%  Physical Exam  Constitutional: He is oriented to person, place, and time. He appears well-developed.  HENT:  Head: Normocephalic and atraumatic.  Eyes: Conjunctivae normal and EOM are normal. No scleral icterus.  Neck: Neck supple. No thyromegaly present.  Cardiovascular: Normal rate and regular rhythm.  Exam reveals no gallop and no friction rub.   No murmur heard. Pulmonary/Chest: No stridor. He has no wheezes. He has no rales. He exhibits no tenderness.  Abdominal: He exhibits no distension. There is no tenderness. There is no rebound.  Genitourinary: Penis normal.  Musculoskeletal: Normal range of motion. He exhibits no edema.  Lymphadenopathy:    He has no cervical adenopathy.  Neurological: He is oriented to person, place, and time. Coordination normal.  Skin: No rash noted. No erythema.  Psychiatric: He has a normal mood and affect. His behavior is normal.    ED Course  Procedures (including critical care time)  Labs Reviewed  URINALYSIS, ROUTINE W REFLEX MICROSCOPIC - Abnormal; Notable for the following:  Hgb urine dipstick TRACE (*)     All other components within normal limits  URINE MICROSCOPIC-ADD ON  GLUCOSE, CAPILLARY  GC/CHLAMYDIA PROBE AMP   No results found.   1. Dysuria       MDM          Benny Lennert, MD 10/28/12 2009

## 2012-10-28 NOTE — ED Notes (Signed)
Dysuria for 4 days No vomiting , no diarrhea,  Alert,

## 2012-10-28 NOTE — ED Notes (Signed)
Pt requesting to have his BS checked, thinks that may be his problem.

## 2012-10-28 NOTE — ED Notes (Signed)
POC CBG Result: 84

## 2012-10-30 LAB — GC/CHLAMYDIA PROBE AMP
CT Probe RNA: NEGATIVE
GC Probe RNA: NEGATIVE

## 2012-11-06 ENCOUNTER — Emergency Department (HOSPITAL_COMMUNITY)
Admission: EM | Admit: 2012-11-06 | Discharge: 2012-11-06 | Disposition: A | Discharge status: Home / Self Care | Payer: Self-pay | Attending: Emergency Medicine | Admitting: Emergency Medicine

## 2012-11-06 ENCOUNTER — Encounter (HOSPITAL_COMMUNITY): Payer: Self-pay

## 2012-11-06 DIAGNOSIS — R300 Dysuria: Secondary | ICD-10-CM | POA: Insufficient documentation

## 2012-11-06 DIAGNOSIS — E119 Type 2 diabetes mellitus without complications: Secondary | ICD-10-CM | POA: Insufficient documentation

## 2012-11-06 DIAGNOSIS — F17200 Nicotine dependence, unspecified, uncomplicated: Secondary | ICD-10-CM | POA: Insufficient documentation

## 2012-11-06 DIAGNOSIS — Z046 Encounter for general psychiatric examination, requested by authority: Secondary | ICD-10-CM | POA: Insufficient documentation

## 2012-11-06 LAB — CBC WITH DIFFERENTIAL/PLATELET
Basophils Absolute: 0 10*3/uL (ref 0.0–0.1)
Basophils Relative: 1 % (ref 0–1)
Eosinophils Absolute: 0.2 10*3/uL (ref 0.0–0.7)
Eosinophils Relative: 3 % (ref 0–5)
Lymphs Abs: 2.2 10*3/uL (ref 0.7–4.0)
MCH: 31 pg (ref 26.0–34.0)
MCHC: 34.5 g/dL (ref 30.0–36.0)
MCV: 89.9 fL (ref 78.0–100.0)
Platelets: 144 10*3/uL — ABNORMAL LOW (ref 150–400)
RDW: 13 % (ref 11.5–15.5)

## 2012-11-06 LAB — BASIC METABOLIC PANEL
Calcium: 9.7 mg/dL (ref 8.4–10.5)
GFR calc non Af Amer: >90 mL/min (ref >90)
Glucose, Bld: 105 mg/dL — ABNORMAL HIGH (ref 70–99)
Sodium: 134 mEq/L — ABNORMAL LOW (ref 135–145)

## 2012-11-06 LAB — URINALYSIS, ROUTINE W REFLEX MICROSCOPIC
Glucose, UA: NEGATIVE mg/dL
Nitrite: NEGATIVE
Specific Gravity, Urine: <1.005 — ABNORMAL LOW (ref 1.005–1.030)
pH: 6 (ref 5.0–8.0)

## 2012-11-06 LAB — RAPID URINE DRUG SCREEN, HOSP PERFORMED: Benzodiazepines: NOT DETECTED

## 2012-11-06 MED ORDER — AZITHROMYCIN 250 MG PO TABS
1000.0000 mg | ORAL_TABLET | Freq: Once | ORAL | Status: AC
  Administered 2012-11-06: 1000 mg via ORAL
  Filled 2012-11-06: qty 4

## 2012-11-06 NOTE — ED Provider Notes (Signed)
History     CSN: [id number]  Arrival date & time 11/06/12  1127   First MD Initiated Contact with Patient 11/06/12 1258      Chief Complaint  Patient presents with  . Dysuria  . V70.1    (Consider location/radiation/quality/duration/timing/severity/associated sxs/prior treatment) Patient is a 47 y.o. male presenting with dysuria. The history is provided by the patient (mother complains of dysuria). No language interpreter was used.  Dysuria  This is a new problem. The current episode started 6 to 12 hours ago. The problem occurs every urination. The problem has not changed since onset.The quality of the pain is described as burning. The pain is at a severity of 3/10. The pain is mild. There has been no fever. The fever has been present for less than 1 day. Pertinent negatives include no chills, no frequency and no hematuria. He has tried nothing for the symptoms. His past medical history does not include kidney stones.    Past Medical History  Diagnosis Date  . Diabetes mellitus   . Headache   . Vertigo     History reviewed. No pertinent past surgical history.  No family history on file.  History  Substance Use Topics  . Smoking status: Current Every Day Smoker -- 0.0 packs/day    Types: Cigarettes    Last Attempt to Quit: 10/08/2011  . Smokeless tobacco: Not on file  . Alcohol Use: Yes      Review of Systems  Constitutional: Negative for chills and fatigue.  HENT: Negative for congestion, sinus pressure and ear discharge.   Eyes: Negative for discharge.  Respiratory: Negative for cough.   Cardiovascular: Negative for chest pain.  Gastrointestinal: Negative for abdominal pain and diarrhea.  Genitourinary: Positive for dysuria. Negative for frequency and hematuria.  Musculoskeletal: Negative for back pain.  Skin: Negative for rash.  Neurological: Negative for seizures and headaches.  Hematological: Negative.   Psychiatric/Behavioral: Negative for hallucinations.      Allergies  Review of patient's allergies indicates no known allergies.  Home Medications   Current Outpatient Rx  Name  Route  Sig  Dispense  Refill  . CIPROFLOXACIN HCL 500 MG PO TABS   Oral   Take 1 tablet (500 mg total) by mouth 2 (two) times daily.   28 tablet   0   . IBUPROFEN 200 MG PO TABS   Oral   Take 600 mg by mouth as needed. For pain            BP 151/80  Pulse 65  Temp 98 F (36.7 C) (Oral)  SpO2 98%  Physical Exam  Constitutional: He is oriented to person, place, and time. He appears well-developed.  HENT:  Head: Normocephalic and atraumatic.  Eyes: Conjunctivae normal and EOM are normal. No scleral icterus.  Neck: Neck supple. No thyromegaly present.  Cardiovascular: Normal rate and regular rhythm.  Exam reveals no gallop and no friction rub.   No murmur heard. Pulmonary/Chest: No stridor. He has no wheezes. He has no rales. He exhibits no tenderness.  Abdominal: He exhibits no distension. There is no tenderness. There is no rebound.  Musculoskeletal: Normal range of motion. He exhibits no edema.  Lymphadenopathy:    He has no cervical adenopathy.  Neurological: He is oriented to person, place, and time. Coordination normal.  Skin: No rash noted. No erythema.  Psychiatric: He has a normal mood and affect. His behavior is normal.    ED Course  Procedures (including critical care time)  Labs Reviewed  URINALYSIS, ROUTINE W REFLEX MICROSCOPIC - Abnormal; Notable for the following:    Color, Urine STRAW (*)     Specific Gravity, Urine <1.005 (*)     Hgb urine dipstick TRACE (*)     All other components within normal limits  CBC WITH DIFFERENTIAL - Abnormal; Notable for the following:    Platelets 144 (*)     All other components within normal limits  BASIC METABOLIC PANEL - Abnormal; Notable for the following:    Sodium 134 (*)     Glucose, Bld 105 (*)     All other components within normal limits  URINE RAPID DRUG SCREEN (HOSP PERFORMED)   ETHANOL  URINE MICROSCOPIC-ADD ON  GC/CHLAMYDIA PROBE AMP   No results found.   1. Dysuria       MDM          Benny Lennert, MD 11/06/12 1517

## 2012-11-06 NOTE — ED Notes (Signed)
Interpreter called for paitent RN aware.

## 2012-11-06 NOTE — ED Notes (Signed)
Pt c/o dysuria, reports was here recently and put on cipro.

## 2012-11-06 NOTE — ED Notes (Signed)
Pt reports continued burning with urination and frequency of urination. No belly pain, no fevers, no discharge. Patient reports situational anxiety but strongly denies SI and HI, denies sadness or depression. Appears anxious. Reports not working for 3 weeks.

## 2012-11-06 NOTE — ED Notes (Signed)
Pt reports feeling depressed and having SI x 3 weeks.  Denies HI.

## 2012-11-06 NOTE — ED Notes (Signed)
Pt wanded by security, all belongings removed by pt, bagged and labeled. Placed on lock up.  Interpreter called by [employer] operational officer.  Awaiting arrival to er.

## 2013-06-29 ENCOUNTER — Emergency Department (HOSPITAL_COMMUNITY)
Admission: EM | Admit: 2013-06-29 | Discharge: 2013-06-30 | Disposition: A | Discharge status: Home / Self Care | Payer: Self-pay | Attending: Emergency Medicine | Admitting: Emergency Medicine

## 2013-06-29 ENCOUNTER — Emergency Department (HOSPITAL_COMMUNITY): Payer: Self-pay

## 2013-06-29 ENCOUNTER — Encounter (HOSPITAL_COMMUNITY): Payer: Self-pay | Admitting: *Deleted

## 2013-06-29 DIAGNOSIS — L089 Local infection of the skin and subcutaneous tissue, unspecified: Secondary | ICD-10-CM

## 2013-06-29 DIAGNOSIS — S61409A Unspecified open wound of unspecified hand, initial encounter: Secondary | ICD-10-CM | POA: Insufficient documentation

## 2013-06-29 DIAGNOSIS — X58XXXA Exposure to other specified factors, initial encounter: Secondary | ICD-10-CM | POA: Insufficient documentation

## 2013-06-29 DIAGNOSIS — F17200 Nicotine dependence, unspecified, uncomplicated: Secondary | ICD-10-CM | POA: Insufficient documentation

## 2013-06-29 DIAGNOSIS — E119 Type 2 diabetes mellitus without complications: Secondary | ICD-10-CM | POA: Insufficient documentation

## 2013-06-29 DIAGNOSIS — Y929 Unspecified place or not applicable: Secondary | ICD-10-CM | POA: Insufficient documentation

## 2013-06-29 DIAGNOSIS — Y939 Activity, unspecified: Secondary | ICD-10-CM | POA: Insufficient documentation

## 2013-06-29 LAB — CBC WITH DIFFERENTIAL/PLATELET
Eosinophils Relative: 2 % (ref 0–5)
HCT: 44.1 % (ref 39.0–52.0)
Lymphocytes Relative: 19 % (ref 12–46)
Lymphs Abs: 2.2 10*3/uL (ref 0.7–4.0)
MCV: 94 fL (ref 78.0–100.0)
Monocytes Absolute: 1.2 10*3/uL — ABNORMAL HIGH (ref 0.1–1.0)
Platelets: 134 10*3/uL — ABNORMAL LOW (ref 150–400)
RBC: 4.69 MIL/uL (ref 4.22–5.81)
WBC: 12 10*3/uL — ABNORMAL HIGH (ref 4.0–10.5)

## 2013-06-29 LAB — BASIC METABOLIC PANEL
CO2: 25 mEq/L (ref 19–32)
Calcium: 8.9 mg/dL (ref 8.4–10.5)
Glucose, Bld: 152 mg/dL — ABNORMAL HIGH (ref 70–99)
Sodium: 139 mEq/L (ref 135–145)

## 2013-06-29 MED ORDER — SULFAMETHOXAZOLE-TRIMETHOPRIM 800-160 MG PO TABS: 1.0000 | ORAL_TABLET | Freq: Two times a day (BID) | ORAL | Status: AC

## 2013-06-29 MED ORDER — SODIUM CHLORIDE 0.9 % IV SOLN
INTRAVENOUS | Status: DC
  Administered 2013-06-29: 23:00:00 via INTRAVENOUS

## 2013-06-29 MED ORDER — KETOROLAC TROMETHAMINE 30 MG/ML IJ SOLN
30.0000 mg | Freq: Once | INTRAMUSCULAR | Status: AC
  Administered 2013-06-29: 30 mg via INTRAVENOUS
  Filled 2013-06-29: qty 1

## 2013-06-29 MED ORDER — VANCOMYCIN HCL IN DEXTROSE 1-5 GM/200ML-% IV SOLN
1000.0000 mg | Freq: Once | INTRAVENOUS | Status: AC
  Administered 2013-06-29: 1000 mg via INTRAVENOUS
  Filled 2013-06-29: qty 200

## 2013-06-29 NOTE — ED Notes (Signed)
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 Rt hand with noted swelling and papule at base of middle finger. Pt is taking allergy relief OTC and Ampicilina ( an anti infection medication from [place])  NAD noted at this time.

## 2013-06-30 MED ORDER — IBUPROFEN 600 MG PO TABS: 600.0000 mg | ORAL_TABLET | Freq: Four times a day (QID) | ORAL | Status: DC | PRN

## 2013-06-30 NOTE — ED Provider Notes (Signed)
CSN: [id number]     Arrival date & time 06/29/13  2111 History   First MD Initiated Contact with Patient 06/29/13 2133     Chief Complaint  Patient presents with  . Insect Bite   (Consider location/radiation/quality/duration/timing/severity/associated sxs/prior Treatment) HPI Comments: Scott Byrd is a 47 y.o. Male presenting with pain and swelling of his right dorsal hand since yesterday.  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  He woke today with increased pain, swelling and redness.  He has increased pain with attempts to flex make a fist, but cannot fully make a fist due to the degree of swelling.  He reports pain radiating into his mid forearm which also feels increasingly warm to him as is his hand.  He has taken ibuprofen and was also started on Ampicilina an otc [place] abx today, having taken one dose so far.  He denies fevers and chills , nausea or vomiting.      The history is provided by the patient.    Past Medical History  Diagnosis Date  . Diabetes mellitus   . Headache(784.0)   . Vertigo    History reviewed. No pertinent past surgical history. No family history on file. History  Substance Use Topics  . Smoking status: Current Every Day Smoker -- 0.03 packs/day    Types: Cigarettes    Last Attempt to Quit: 10/08/2011  . Smokeless tobacco: Not on file  . Alcohol Use: Yes    Review of Systems  Constitutional: Negative for fever and chills.  HENT: Negative for facial swelling.   Respiratory: Negative for shortness of breath and wheezing.   Musculoskeletal: Positive for arthralgias.  Skin: Positive for color change and wound.  Neurological: Negative for numbness.    Allergies  Review of patient's allergies indicates no known allergies.  Home Medications   Current Outpatient Rx  Name  Route  Sig   Dispense  Refill  . fexofenadine (ALLEGRA) 180 MG tablet   Oral   Take 180 mg by mouth daily.         Marland Kitchen ibuprofen (ADVIL,MOTRIN) 200 MG tablet   Oral   Take 600 mg by mouth every 6 (six) hours as needed for pain. For pain         . PRESCRIPTION MEDICATION   Oral   Take 1 capsule by mouth every 6 (six) hours. Patient taking Ampicilina 500mg  (started on 06/29/13)         . ibuprofen (ADVIL,MOTRIN) 600 MG tablet   Oral   Take 1 tablet (600 mg total) by mouth every 6 (six) hours as needed for pain.   20 tablet   0   . sulfamethoxazole-trimethoprim (BACTRIM DS,SEPTRA DS) 800-160 MG per tablet   Oral   Take 1 tablet by mouth 2 (two) times daily.   14 tablet   0    BP 148/83  Pulse 84  Temp(Src) 99.3 F (37.4 C) (Oral)  Resp 20  Ht 5\' 5"  (1.651 m)  Wt 201 lb 8 oz (91.4 kg)  BMI 33.53 kg/m2  SpO2 99% Physical Exam  Constitutional: He is oriented to person, place, and time. He appears well-developed and well-nourished.  HENT:  Head: Normocephalic.  Cardiovascular: Normal rate.   Pulmonary/Chest: Effort normal.  Musculoskeletal: He exhibits edema and tenderness.  ttp  entire right dorsal hand with increased erythema and warmth, edematous.  Pt unable to flex fingers secondary to edema.  Warmth radiating to mid forearm without erythema or red streaking in forearm.  No epitrochlear adenopathy.  Radial pulse full, less than 3 sec cap refill.  Small puncture noted at 3rd mcp , no drainage, induration or fluctuance.  Neurological: He is alert and oriented to person, place, and time. No sensory deficit.  Skin: Laceration noted.    ED Course  Procedures (including critical care time) Labs Review Labs Reviewed  CBC WITH DIFFERENTIAL - Abnormal; Notable for the following:    WBC 12.0 (*)    Platelets 134 (*)    Neutro Abs 8.3 (*)    Monocytes Absolute 1.2 (*)    All other components within normal limits  BASIC METABOLIC PANEL - Abnormal; Notable for the following:     Potassium 3.4 (*)    Glucose, Bld 152 (*)    All other components within normal limits   Imaging Review Dg Hand Complete Right  06/29/2013   *RADIOLOGY REPORT*  Clinical Data: Insect bite.  RIGHT HAND - COMPLETE 3+ VIEW  Comparison: None.  Findings: Marked soft tissue swelling of the hand, particularly dorsally.  No underlying osseous erosion or focal joint space narrowing.  No acute fracture or malalignment.  Remote to fifth digit tuft fracture.  IMPRESSION: No evidence of osseous infection or radiodense foreign body.   Original Report Authenticated By: Tiburcio Pea    MDM   1. Puncture wound of right hand with infection    Pt was seen by Dr. Adriana Simas during visit.  Pt was given vancomycin 1 gram IV,  Labs drawn and reviewed.  Pt was advised admission for further IV abx  Which patient refused.  He was strongly encouraged to followup with a hand specialist in [place] which he states he probably will not do as he needs to work. Nonetheless was given referral to Dr. Janee Morn for this.  Recommended recheck here at a minimum tomorrow.  He states he may do this if not better.  He was prescribed bactrim,  Ibuprofen for inflammation.  It was discussed with him that this infection will probably get worse without antibiotics and he may require an operation if it worsens,  Could also have permanent problems with the hand, including loss of hand if this is not treated properly.  Pt understands risks.   Burgess Amor, PA-C 06/30/13 0205  Burgess Amor, PA-C 06/30/13 9604

## 2013-07-02 NOTE — ED Provider Notes (Signed)
Medical screening examination/treatment/procedure(s) were conducted as a shared visit with non-physician practitioner(s) and myself.  I personally evaluated the patient during the encounter.  Obvious cellulitis of right hand. I recommended patient be admitted to the hospital. He understands risks and benefits of discharging himself. He will return if infection worsens  Donnetta Hutching, MD 07/02/13 [phone]

## 2014-11-02 IMAGING — CR DG HAND COMPLETE 3+V*R*
3 series · 3 of 3 positions shown · non-contrast
Comparison: None.

CLINICAL DATA: Insect bite.

RIGHT HAND - COMPLETE 3+ VIEW

[view not recorded (1 of 3)]
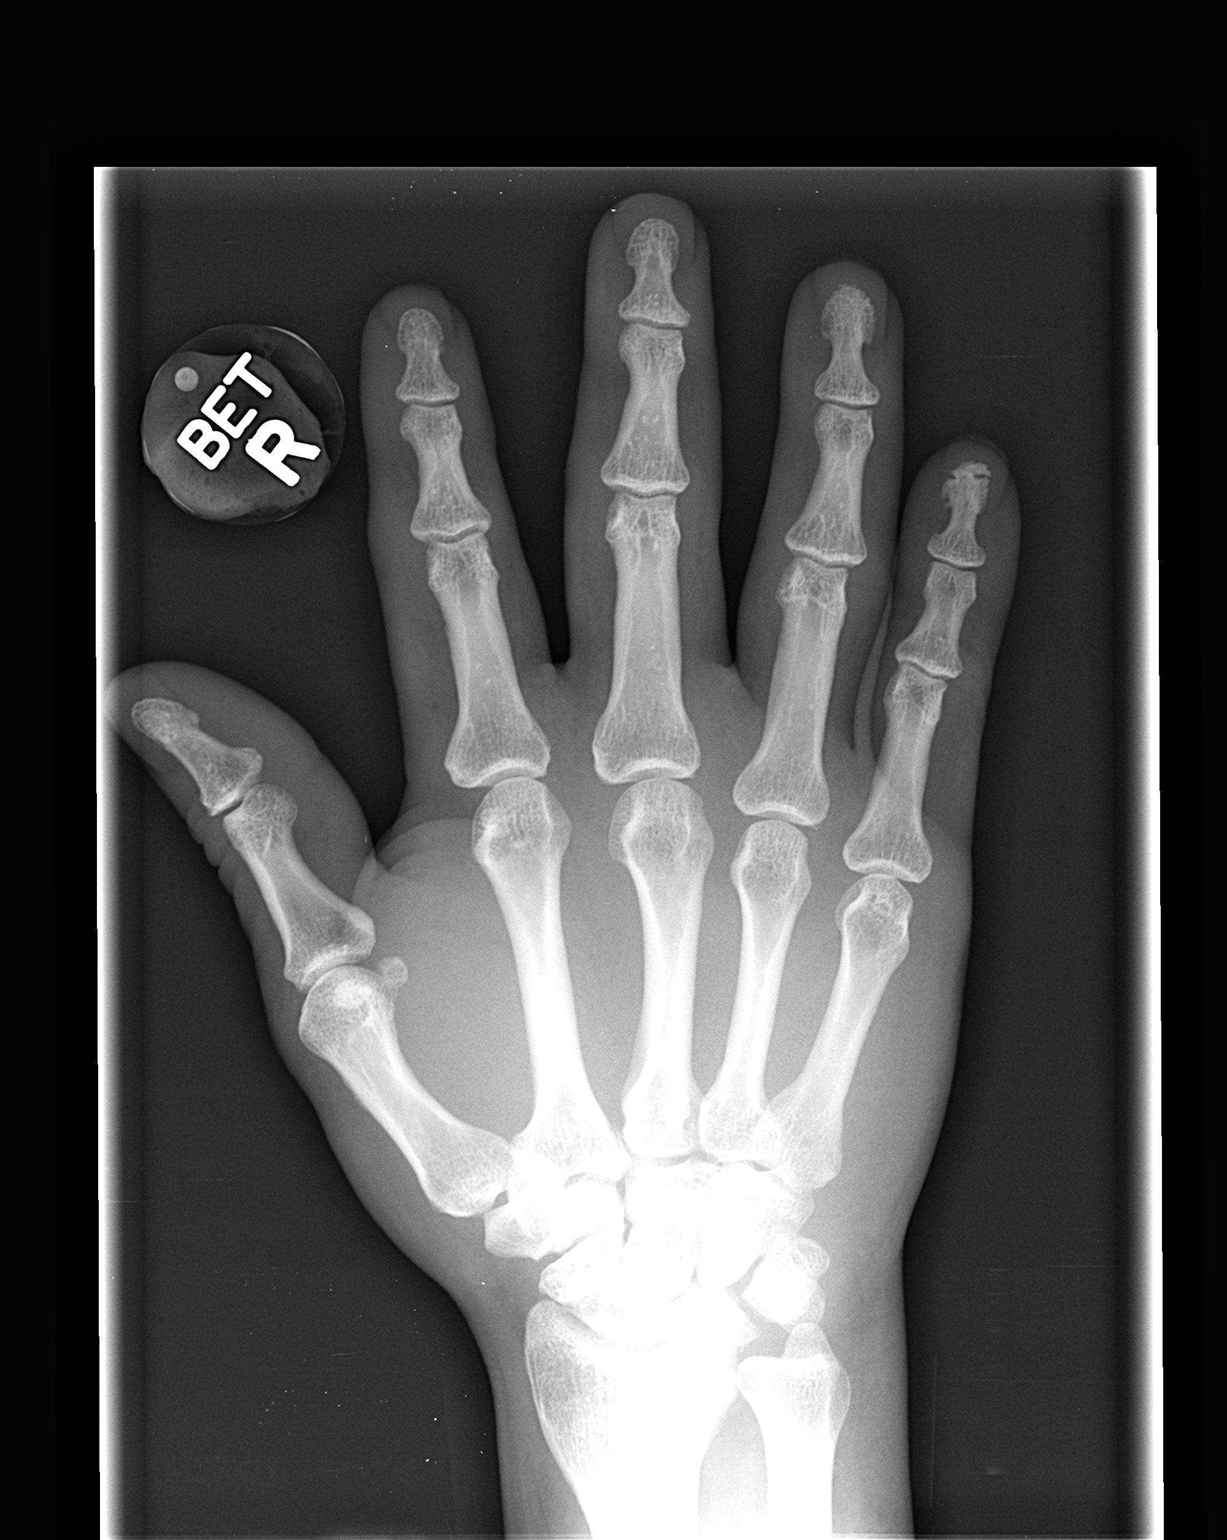

[view not recorded (2 of 3)]
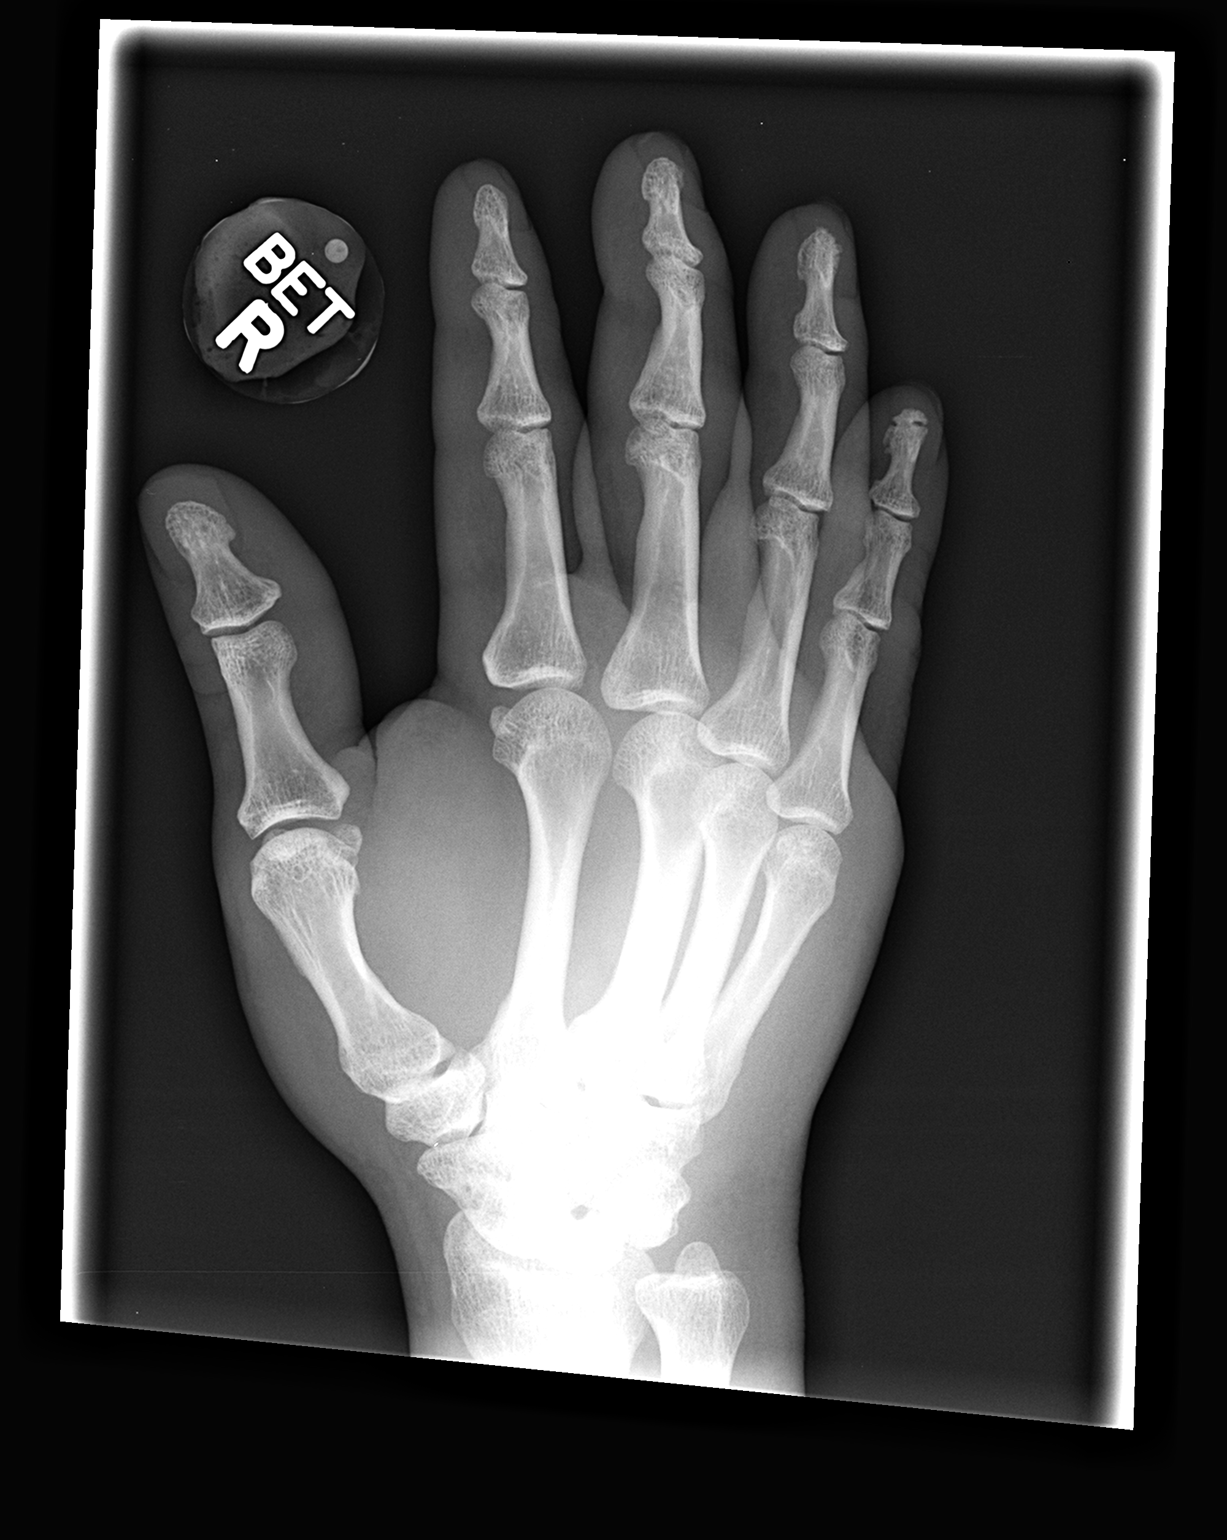

[view not recorded (3 of 3)]
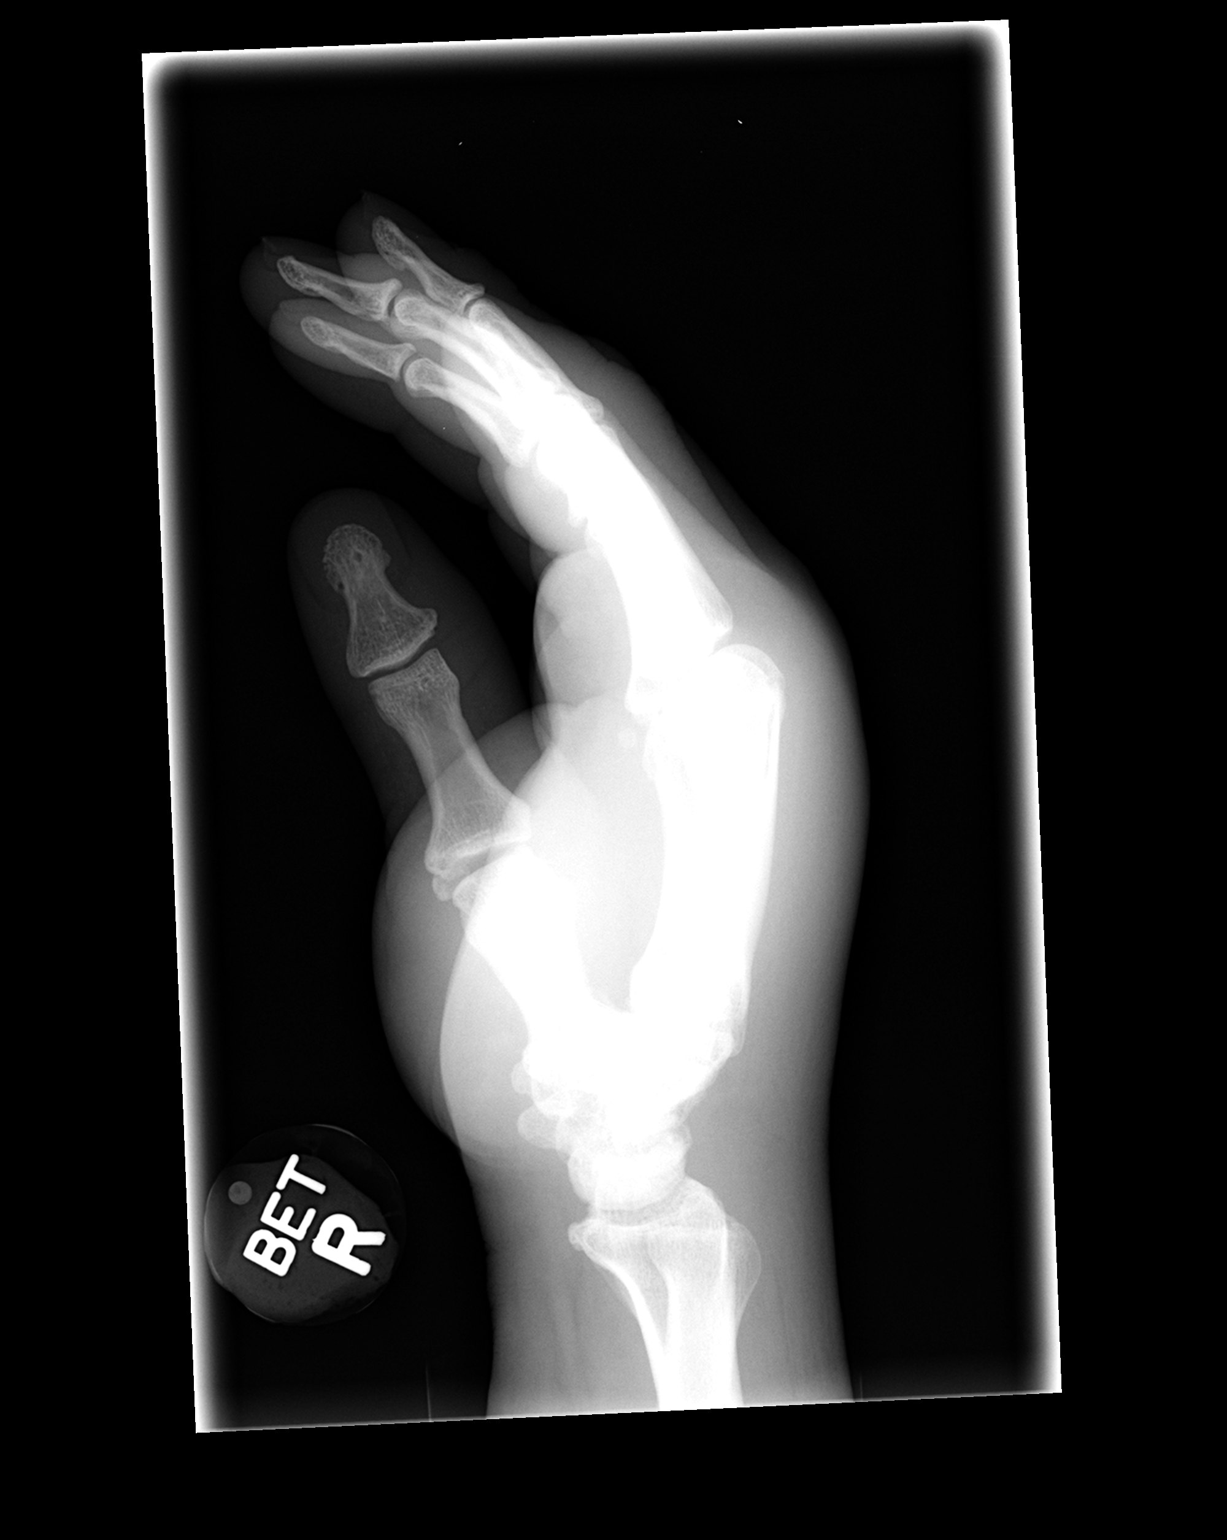

[3 of 3 positions shown; findings below may reference images not displayed]

FINDINGS: Marked soft tissue swelling of the hand, particularly
dorsally.  No underlying osseous erosion or focal joint space
narrowing.  No acute fracture or malalignment.  Remote to fifth
digit tuft fracture.
IMPRESSION: No evidence of osseous infection or radiodense foreign body.

## 2020-12-08 ENCOUNTER — Emergency Department (HOSPITAL_COMMUNITY)
Admission: EM | Admit: 2020-12-08 | Discharge: 2020-12-09 | Disposition: A | Discharge status: Home / Self Care | Payer: Self-pay | Attending: Emergency Medicine | Admitting: Emergency Medicine

## 2020-12-08 ENCOUNTER — Encounter (HOSPITAL_COMMUNITY): Payer: Self-pay

## 2020-12-08 DIAGNOSIS — R519 Headache, unspecified: Secondary | ICD-10-CM | POA: Insufficient documentation

## 2020-12-08 DIAGNOSIS — R110 Nausea: Secondary | ICD-10-CM | POA: Insufficient documentation

## 2020-12-08 DIAGNOSIS — Z5321 Procedure and treatment not carried out due to patient leaving prior to being seen by health care provider: Secondary | ICD-10-CM | POA: Insufficient documentation

## 2020-12-08 LAB — BASIC METABOLIC PANEL
Anion gap: 11 (ref 5–15)
BUN: 21 mg/dL — ABNORMAL HIGH (ref 6–20)
CO2: 23 mmol/L (ref 22–32)
Calcium: 9.3 mg/dL (ref 8.9–10.3)
Chloride: 103 mmol/L (ref 98–111)
Creatinine, Ser: 1.39 mg/dL — ABNORMAL HIGH (ref 0.61–1.24)
GFR, Estimated: >60 mL/min (ref >60)
Glucose, Bld: 111 mg/dL — ABNORMAL HIGH (ref 70–99)
Potassium: 4.3 mmol/L (ref 3.5–5.1)
Sodium: 137 mmol/L (ref 135–145)

## 2020-12-08 LAB — CBC
HCT: 48.4 % (ref 39.0–52.0)
Hemoglobin: 15.6 g/dL (ref 13.0–17.0)
MCH: 29.8 pg (ref 26.0–34.0)
MCHC: 32.2 g/dL (ref 30.0–36.0)
MCV: 92.4 fL (ref 80.0–100.0)
Platelets: 168 10*3/uL (ref 150–400)
RBC: 5.24 MIL/uL (ref 4.22–5.81)
RDW: 13.2 % (ref 11.5–15.5)
WBC: 9.4 10*3/uL (ref 4.0–10.5)
nRBC: 0 % (ref 0.0–0.2)

## 2020-12-08 NOTE — ED Triage Notes (Signed)
Pt reports that his BP has been high over the past weeks, went to see his PCP and had recent changes to meds, pt has had headaches and some nausea

## 2020-12-09 NOTE — ED Notes (Signed)
Pt stated that he cannot wait anymore and will be leaving.

## 2022-05-24 ENCOUNTER — Observation Stay (HOSPITAL_COMMUNITY): Payer: Self-pay

## 2022-05-24 ENCOUNTER — Encounter (HOSPITAL_COMMUNITY): Payer: Self-pay | Admitting: Internal Medicine

## 2022-05-24 ENCOUNTER — Observation Stay (HOSPITAL_COMMUNITY)
Admission: EM | Admit: 2022-05-24 | Discharge: 2022-05-25 | Discharge status: Against Medical Advice | Payer: Self-pay | Attending: Internal Medicine | Admitting: Internal Medicine

## 2022-05-24 ENCOUNTER — Other Ambulatory Visit: Payer: Self-pay

## 2022-05-24 ENCOUNTER — Emergency Department (HOSPITAL_COMMUNITY): Payer: Self-pay

## 2022-05-24 DIAGNOSIS — Z79899 Other long term (current) drug therapy: Secondary | ICD-10-CM | POA: Insufficient documentation

## 2022-05-24 DIAGNOSIS — F17210 Nicotine dependence, cigarettes, uncomplicated: Secondary | ICD-10-CM | POA: Diagnosis present

## 2022-05-24 DIAGNOSIS — E876 Hypokalemia: Secondary | ICD-10-CM | POA: Diagnosis present

## 2022-05-24 DIAGNOSIS — Z20822 Contact with and (suspected) exposure to covid-19: Secondary | ICD-10-CM | POA: Insufficient documentation

## 2022-05-24 DIAGNOSIS — T675XXA Heat exhaustion, unspecified, initial encounter: Secondary | ICD-10-CM | POA: Insufficient documentation

## 2022-05-24 DIAGNOSIS — N179 Acute kidney failure, unspecified: Principal | ICD-10-CM | POA: Insufficient documentation

## 2022-05-24 DIAGNOSIS — N189 Chronic kidney disease, unspecified: Secondary | ICD-10-CM | POA: Diagnosis present

## 2022-05-24 DIAGNOSIS — R531 Weakness: Secondary | ICD-10-CM | POA: Insufficient documentation

## 2022-05-24 DIAGNOSIS — R030 Elevated blood-pressure reading, without diagnosis of hypertension: Secondary | ICD-10-CM | POA: Diagnosis present

## 2022-05-24 DIAGNOSIS — E119 Type 2 diabetes mellitus without complications: Secondary | ICD-10-CM

## 2022-05-24 LAB — COMPREHENSIVE METABOLIC PANEL
ALT: 29 U/L (ref 0–44)
AST: 28 U/L (ref 15–41)
Albumin: 4 g/dL (ref 3.5–5.0)
Alkaline Phosphatase: 68 U/L (ref 38–126)
Anion gap: 11 (ref 5–15)
BUN: 41 mg/dL — ABNORMAL HIGH (ref 6–20)
CO2: 25 mmol/L (ref 22–32)
Calcium: 8.8 mg/dL — ABNORMAL LOW (ref 8.9–10.3)
Chloride: 102 mmol/L (ref 98–111)
Creatinine, Ser: 2.19 mg/dL — ABNORMAL HIGH (ref 0.61–1.24)
GFR, Estimated: 35 mL/min — ABNORMAL LOW (ref >60)
Glucose, Bld: 92 mg/dL (ref 70–99)
Potassium: 3.4 mmol/L — ABNORMAL LOW (ref 3.5–5.1)
Sodium: 138 mmol/L (ref 135–145)
Total Bilirubin: 0.9 mg/dL (ref 0.3–1.2)
Total Protein: 7.9 g/dL (ref 6.5–8.1)

## 2022-05-24 LAB — CBC
HCT: 44.7 % (ref 39.0–52.0)
Hemoglobin: 14.6 g/dL (ref 13.0–17.0)
MCH: 30.3 pg (ref 26.0–34.0)
MCHC: 32.7 g/dL (ref 30.0–36.0)
MCV: 92.7 fL (ref 80.0–100.0)
Platelets: 160 10*3/uL (ref 150–400)
RBC: 4.82 MIL/uL (ref 4.22–5.81)
RDW: 12.9 % (ref 11.5–15.5)
WBC: 8.1 10*3/uL (ref 4.0–10.5)
nRBC: 0 % (ref 0.0–0.2)

## 2022-05-24 LAB — RAPID URINE DRUG SCREEN, HOSP PERFORMED
Amphetamines: NOT DETECTED
Barbiturates: NOT DETECTED
Benzodiazepines: NOT DETECTED
Cocaine: NOT DETECTED
Opiates: NOT DETECTED
Tetrahydrocannabinol: NOT DETECTED

## 2022-05-24 LAB — URINALYSIS, ROUTINE W REFLEX MICROSCOPIC
Bacteria, UA: NONE SEEN
Bilirubin Urine: NEGATIVE
Glucose, UA: NEGATIVE mg/dL
Ketones, ur: NEGATIVE mg/dL
Leukocytes,Ua: NEGATIVE
Nitrite: NEGATIVE
Protein, ur: 100 mg/dL — AB
Specific Gravity, Urine: 1.017 (ref 1.005–1.030)
pH: 5 (ref 5.0–8.0)

## 2022-05-24 LAB — BASIC METABOLIC PANEL
Anion gap: 10 (ref 5–15)
BUN: 39 mg/dL — ABNORMAL HIGH (ref 6–20)
CO2: 25 mmol/L (ref 22–32)
Calcium: 8.8 mg/dL — ABNORMAL LOW (ref 8.9–10.3)
Chloride: 103 mmol/L (ref 98–111)
Creatinine, Ser: 2.23 mg/dL — ABNORMAL HIGH (ref 0.61–1.24)
GFR, Estimated: 34 mL/min — ABNORMAL LOW (ref >60)
Glucose, Bld: 120 mg/dL — ABNORMAL HIGH (ref 70–99)
Potassium: 3.2 mmol/L — ABNORMAL LOW (ref 3.5–5.1)
Sodium: 138 mmol/L (ref 135–145)

## 2022-05-24 LAB — CK: Total CK: 393 U/L (ref 49–397)

## 2022-05-24 LAB — GLUCOSE, CAPILLARY: Glucose-Capillary: 119 mg/dL — ABNORMAL HIGH (ref 70–99)

## 2022-05-24 LAB — CREATININE, URINE, RANDOM: Creatinine, Urine: 142 mg/dL

## 2022-05-24 LAB — MAGNESIUM: Magnesium: 2.6 mg/dL — ABNORMAL HIGH (ref 1.7–2.4)

## 2022-05-24 LAB — SODIUM, URINE, RANDOM: Sodium, Ur: 51 mmol/L

## 2022-05-24 LAB — SARS CORONAVIRUS 2 BY RT PCR: SARS Coronavirus 2 by RT PCR: NEGATIVE

## 2022-05-24 LAB — LACTIC ACID, PLASMA: Lactic Acid, Venous: 2.1 mmol/L (ref 0.5–1.9)

## 2022-05-24 MED ORDER — POTASSIUM CHLORIDE CRYS ER 20 MEQ PO TBCR
40.0000 meq | EXTENDED_RELEASE_TABLET | Freq: Once | ORAL | Status: AC
Start: 2022-05-24 — End: 2022-05-24
  Administered 2022-05-24: 40 meq via ORAL

## 2022-05-24 MED ORDER — ACETAMINOPHEN 650 MG RE SUPP: 650.0000 mg | Freq: Four times a day (QID) | RECTAL | Status: DC | PRN

## 2022-05-24 MED ORDER — LACTATED RINGERS IV SOLN: INTRAVENOUS | Status: DC

## 2022-05-24 MED ORDER — POTASSIUM CHLORIDE CRYS ER 20 MEQ PO TBCR
40.0000 meq | EXTENDED_RELEASE_TABLET | Freq: Once | ORAL | Status: DC
  Filled 2022-05-24: qty 2

## 2022-05-24 MED ORDER — SODIUM CHLORIDE 0.9 % IV BOLUS
1000.0000 mL | Freq: Once | INTRAVENOUS | Status: AC
  Administered 2022-05-24: 1000 mL via INTRAVENOUS

## 2022-05-24 MED ORDER — POLYETHYLENE GLYCOL 3350 17 G PO PACK
17.0000 g | PACK | Freq: Every day | ORAL | Status: DC | PRN
Start: 2022-05-24 — End: 2022-05-25

## 2022-05-24 MED ORDER — ORAL CARE MOUTH RINSE: 15.0000 mL | OROMUCOSAL | Status: DC | PRN

## 2022-05-24 MED ORDER — INSULIN ASPART 100 UNIT/ML IJ SOLN: 0.0000 [IU] | Freq: Three times a day (TID) | INTRAMUSCULAR | Status: DC

## 2022-05-24 MED ORDER — ENOXAPARIN SODIUM 40 MG/0.4ML IJ SOSY
40.0000 mg | PREFILLED_SYRINGE | INTRAMUSCULAR | Status: DC
  Administered 2022-05-24: 40 mg via SUBCUTANEOUS
  Filled 2022-05-24: qty 0.4

## 2022-05-24 MED ORDER — HYDRALAZINE HCL 20 MG/ML IJ SOLN: 10.0000 mg | Freq: Four times a day (QID) | INTRAMUSCULAR | Status: DC | PRN

## 2022-05-24 MED ORDER — ONDANSETRON HCL 4 MG/2ML IJ SOLN: 4.0000 mg | Freq: Four times a day (QID) | INTRAMUSCULAR | Status: DC | PRN

## 2022-05-24 MED ORDER — ACETAMINOPHEN 500 MG PO TABS
1000.0000 mg | ORAL_TABLET | Freq: Once | ORAL | Status: AC
  Administered 2022-05-24: 1000 mg via ORAL
  Filled 2022-05-24: qty 2

## 2022-05-24 MED ORDER — ACETAMINOPHEN 325 MG PO TABS: 650.0000 mg | ORAL_TABLET | Freq: Four times a day (QID) | ORAL | Status: DC | PRN

## 2022-05-24 MED ORDER — ONDANSETRON HCL 4 MG PO TABS: 4.0000 mg | ORAL_TABLET | Freq: Four times a day (QID) | ORAL | Status: DC | PRN

## 2022-05-24 NOTE — ED Provider Notes (Signed)
[facility] EMERGENCY DEPARTMENT Provider Note   CSN: [id number] Arrival date & time: 05/24/22  1440     History  No chief complaint on file.   Scott Byrd is a 56 y.o. male.  Pt is a 56 yo male with no significant pmhx.  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  Starting on Wednesday, July 26, pt developed a headache, stomach cramping, and cramping in his hands and feet.  Pt denies any neurologic sx.  He denies any n/v.       Home Medications Prior to Admission medications   Medication Sig Start Date End Date Taking? Authorizing Provider  fexofenadine (ALLEGRA) 180 MG tablet Take 180 mg by mouth daily.    [provider]  ibuprofen (ADVIL,MOTRIN) 200 MG tablet Take 600 mg by mouth every 6 (six) hours as needed for pain. For pain    [provider]  ibuprofen (ADVIL,MOTRIN) 600 MG tablet Take 1 tablet (600 mg total) by mouth every 6 (six) hours as needed for pain. 06/30/13   Burgess Amor, PA-C  PRESCRIPTION MEDICATION Take 1 capsule by mouth every 6 (six) hours. Patient taking Ampicilina 500mg  (started on 06/29/13)    [provider]      Allergies    Patient has no known allergies.    Review of Systems   Review of Systems  Gastrointestinal:  Positive for abdominal pain.  Musculoskeletal:        Pain in hands and in feet  Neurological:  Positive for weakness and headaches.  All other systems reviewed and are negative.   Physical Exam Updated Vital Signs BP (!) 157/88   Pulse (!) 55   Temp 97.7 F (36.5 C) (Oral)   Resp 18   SpO2 97%  Physical Exam Vitals and nursing note reviewed.  Constitutional:      Appearance: Normal appearance.  HENT:     Head: Normocephalic and atraumatic.     Right Ear: External ear normal.     Left Ear: External ear normal.     Nose: Nose normal.     Mouth/Throat:     Mouth: Mucous membranes are dry.     Pharynx: Oropharynx is clear.  Eyes:     Extraocular  Movements: Extraocular movements intact.     Conjunctiva/sclera: Conjunctivae normal.     Pupils: Pupils are equal, round, and reactive to light.  Cardiovascular:     Rate and Rhythm: Normal rate and regular rhythm.     Pulses: Normal pulses.     Heart sounds: Normal heart sounds.  Pulmonary:     Effort: Pulmonary effort is normal.     Breath sounds: Normal breath sounds.  Abdominal:     General: Abdomen is flat. Bowel sounds are normal.     Palpations: Abdomen is soft.  Musculoskeletal:        General: Normal range of motion.     Cervical back: Normal range of motion and neck supple.  Skin:    General: Skin is warm.     Capillary Refill: Capillary refill takes less than 2 seconds.  Neurological:     General: No focal deficit present.     Mental Status: He is alert and oriented to person, place, and time.  Psychiatric:        Mood and Affect: Mood normal.        Behavior: Behavior normal.     ED Results / Procedures / Treatments  Labs (all labs ordered are listed, but only abnormal results are displayed) Labs Reviewed  COMPREHENSIVE METABOLIC PANEL - Abnormal; Notable for the following components:      Result Value   Potassium 3.4 (*)    BUN 41 (*)    Creatinine, Ser 2.19 (*)    Calcium 8.8 (*)    GFR, Estimated 35 (*)    All other components within normal limits  MAGNESIUM - Abnormal; Notable for the following components:   Magnesium 2.6 (*)    All other components within normal limits  URINALYSIS, ROUTINE W REFLEX MICROSCOPIC - Abnormal; Notable for the following components:   Hgb urine dipstick SMALL (*)    Protein, ur 100 (*)    All other components within normal limits  BASIC METABOLIC PANEL - Abnormal; Notable for the following components:   Potassium 3.2 (*)    Glucose, Bld 120 (*)    BUN 39 (*)    Creatinine, Ser 2.23 (*)    Calcium 8.8 (*)    GFR, Estimated 34 (*)    All other components within normal limits  CBC  CK  UREA NITROGEN, URINE   CREATININE, URINE, RANDOM  SODIUM, URINE, RANDOM  RAPID URINE DRUG SCREEN, HOSP PERFORMED    EKG None  Radiology CT Head Wo Contrast  Result Date: 05/24/2022 CLINICAL DATA:  Headache. EXAM: CT HEAD WITHOUT CONTRAST TECHNIQUE: Contiguous axial images were obtained from the base of the skull through the vertex without intravenous contrast. RADIATION DOSE REDUCTION: This exam was performed according to the departmental dose-optimization program which includes automated exposure control, adjustment of the mA and/or kV according to patient size and/or use of iterative reconstruction technique. 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 No subdural, epidural, or subarachnoid hemorrhage. No other acute abnormalities. Vascular: No hyperdense vessel or unexpected calcification. Skull: Normal. Negative for fracture or focal lesion. Sinuses/Orbits: No acute finding. Other: None. IMPRESSION: No acute intracranial abnormalities. Electronically Signed   By: Gerome Sam III M.D.   On: 05/24/2022 18:04    Procedures Procedures    Medications Ordered in ED Medications  sodium chloride 0.9 % bolus 1,000 mL (0 mLs Intravenous Stopped 05/24/22 1823)  acetaminophen (TYLENOL) tablet 1,000 mg (1,000 mg Oral Given 05/24/22 1655)  sodium chloride 0.9 % bolus 1,000 mL (1,000 mLs Intravenous New Bag/Given 05/24/22 1935)    ED Course/ Medical Decision Making/ A&P                           Medical Decision Making Amount and/or Complexity of Data Reviewed Labs: ordered. Radiology: ordered.  Risk OTC drugs. Decision regarding hospitalization.   This patient presents to the ED for concern of headache, this involves an extensive number of treatment options, and is a complaint that carries with it a high risk of complications and morbidity.  The differential diagnosis includes heat  exhaustion, htn   Co morbidities that complicate the patient evaluation  none   Additional history obtained:  Additional history obtained from epic chart review   Lab Tests:  I Ordered, and personally interpreted labs.  The pertinent results include:  cbc nl, cmp with cr elevated at 2.19 (Cr 1.39 1 year ago); mg 2.6   Imaging Studies ordered:  I ordered imaging studies including ct head  I independently visualized and interpreted imaging which showed  IMPRESSION:  No acute intracranial abnormalities.   I agree with the radiologist interpretation   Cardiac Monitoring:  The patient was maintained on a cardiac monitor.  I personally viewed and interpreted the cardiac monitored which showed an underlying rhythm of: nsr   Medicines ordered and prescription drug management:  I ordered medication including ivfs  for dehydration  Reevaluation of the patient after these medicines showed that the patient improved I have reviewed the patients home medicines and have made adjustments as needed   Test Considered:  ct   Critical Interventions:  ivfs   Consultations Obtained:  I requested consultation with the hospitalist (Dr. Leafy Half),  and discussed lab and imaging findings as well as pertinent plan - he will admit   Problem List / ED Course:  Heat exhaustion with AKI:  pt given IVFs.  Cr not any better.  Pt has no doctor or follow up available, so pt will need to stay.    Reevaluation:  After the interventions noted above, I reevaluated the patient and found that they have :improved   Social Determinants of Health:  Spanish speaker.  Self pay.   Dispostion:  After consideration of the diagnostic results and the patients response to treatment, I feel that the patent would benefit from admission.          Final Clinical Impression(s) / ED Diagnoses Final diagnoses:  AKI (acute kidney injury) (HCC)  Heat exhaustion, initial encounter    Rx / DC  Orders ED Discharge Orders     None         Jacalyn Lefevre, MD 05/24/22 2013

## 2022-05-24 NOTE — ED Notes (Signed)
Admitting provider at bedside.

## 2022-05-24 NOTE — ED Notes (Signed)
X-ray at bedside

## 2022-05-24 NOTE — Plan of Care (Signed)
  Problem: Education: Goal: Knowledge of General Education information will improve Description Including pain rating scale, medication(s)/side effects and non-pharmacologic comfort measures Outcome: Progressing   

## 2022-05-24 NOTE — ED Notes (Signed)
Ultrasound at bedside

## 2022-05-24 NOTE — Assessment & Plan Note (Signed)
   Somewhat elevated blood pressures here although patient does not have a documented history of hypertension nor does he take any antihypertensives  We will initiate oral antihypertensive therapy if hypertension persists and is clinically indicated  As needed intravenous antihypertensives for markedly elevated blood pressures

## 2022-05-24 NOTE — Assessment & Plan Note (Addendum)
.   Patient exhibiting evidence of acute kidney injury most likely secondary to volume depletion however chronic NSAID use is possibly contributing factor . Creatinine is currently 2.23 an increase compared to baseline of 1.39 . Hydrating patient with intravenous isotonic fluids. . Strict input and output monitoring . Monitoring renal function and electrolytes with serial chemistries . Avoiding nephrotoxic agents if at all possible . And obtaining renal ultrasound . Obtain urine electrolytes, obtaining urinalysis, obtaining urine toxicology screen . Advising patient to take daily Tylenol instead for chronic pain

## 2022-05-24 NOTE — Progress Notes (Signed)
New Admission Note:  Arrival Method: Stretcher Mental Orientation: Alert and oriented x 4 Telemetry: N/A Assessment: Completed Skin: Warm and dry IV: NSL Pain: Denies Tubes: N/A Safety Measures: Safety Fall Prevention Plan initiated.  Admission: Completed 5 M  Orientation: Patient has been orientated to the room, unit and the staff. Welcome booklet given.  Family: N/A  Orders have been reviewed and implemented. Will continue to monitor the patient. Call light has been placed within reach and bed alarm has been activated.   Pau Banh BSN, RN  Phone Number: [phone] 

## 2022-05-24 NOTE — Assessment & Plan Note (Signed)
.   Documented history of diabetes although patient does not take any medications . Patient been placed on Accu-Cheks before every meal and nightly with sliding scale insulin . Hemoglobin A1C ordered . Diabetic Diet

## 2022-05-24 NOTE — Assessment & Plan Note (Signed)
?   Counseling on cessation daily ?

## 2022-05-24 NOTE — ED Triage Notes (Signed)
Pt reports headache, hot feet, and tingling over all body this week. Pt thinks he is dehydrated because he has been working out in the sun and had cramping in stomach and hands. At time of triage patient endorses only headache and generalized weakness.

## 2022-05-24 NOTE — H&P (Signed)
History and Physical    Patient: Scott Byrd MRN: [id number] DOA: 05/24/2022  Date of Service: the patient was seen and examined on 05/24/2022  Patient coming from: Home  Chief Complaint: headache, weakness  HPI:   56 year old male with documented history of diabetes mellitus (but doesn't take meds), nicotine dependence who presents to [facility] emergency department with complaints of headache generalized malaise and "feeling hot."  Patient does speak some English however a Spanish interpreter was used for the entirety of this interview and exam.  Patient explains that since at least this past Wednesday he has been feeling significant generalized weakness.  This was initially mild intensity but rapidly became more more severe.  This generalized weakness has been associated with headaches, severe in intensity, primarily located in the left hemisphere, waxing and waning and radiating diffusely.  Patient has additionally been complaining of intermittent feelings of "feeling hot" but denies actually having a fever.  Patient denies any cough, shortness of breath, dysuria, abdominal pain, diarrhea, sick contacts, recent travel or contact with confirmed current COVID-19 infection.  Of note, patient works in [employer] and frequently works outside for at least 8 hours a day.  Patient states that he is hydrating himself appropriately drinking at least 9 bottles of either Gatorade or water daily.  He additionally drinks 1-2 bottles of Coca-Cola with meals.  Patient states he smokes 1 to 2 cigarettes or cigars daily and drinks 1-2 beers every evening.  Patient denies illicit drug use.  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  He states that he regularly takes Motrin as a result taking approximately 2-4 Motrin daily for "as long as I can remember."  Due to patient's progressively worsening symptoms  patient presented to [facility] emergency department for evaluation.  Upon evaluation in the emergency department patient was found to be suffering from suspected acute kidney injury with creatinine of 2.19.  Patient was initiated on intravenous volume resuscitation with repeat chemistry performed later in the day revealing a slightly uptrending creatinine of 2.23.  Due to patient's ongoing symptoms of generalized weakness and concerns for persisting acute kidney injury despite volume resuscitation ER provider has requested the hospitalist group evaluate patient for admission to the hospital.    Review of Systems: Review of Systems  Constitutional:  Positive for malaise/fatigue.  Neurological:  Positive for weakness and headaches.  All other systems reviewed and are negative.    Past Medical History:  Diagnosis Date   Diabetes mellitus    Headache(784.0)    Vertigo     No past surgical history on file.  Social History:  reports that he has been smoking cigarettes. He has been smoking an average of .03 packs per day. He does not have any smokeless tobacco history on file. He reports current alcohol use. He reports that he does not use drugs.  No Known Allergies  No family history on file.  Prior to Admission medications   Medication Sig Start Date End Date Taking? Authorizing Provider  fexofenadine (ALLEGRA) 180 MG tablet Take 180 mg by mouth daily.    [provider]  ibuprofen (ADVIL,MOTRIN) 200 MG tablet Take 600 mg by mouth every 6 (six) hours as needed for pain. For pain    [provider]  ibuprofen (ADVIL,MOTRIN) 600 MG tablet Take 1 tablet (600 mg total) by mouth every 6 (six) hours as needed for pain. 06/30/13  Burgess Amor, PA-C  PRESCRIPTION MEDICATION Take 1 capsule by mouth every 6 (six) hours. Patient taking Ampicilina 500mg  (started on 06/29/13)    [provider]    Physical Exam:  Vitals:   05/24/22 1655 05/24/22 1915 05/24/22  1916 05/24/22 2100  BP: (!) 165/88 (!) 157/88  (!) 143/98  Pulse: 88 (!) 55  70  Resp: 18 18  20   Temp:   97.7 F (36.5 C) 97.9 F (36.6 C)  TempSrc:   Oral Oral  SpO2: 100% 97%  98%    Constitutional: Awake alert and oriented x3, no associated distress.  She is obese Skin: no rashes, no lesions, poor skin turgor noted. Eyes: Pupils are equally reactive to light.  No evidence of scleral icterus or conjunctival pallor.  ENMT: Dry mucous membranes noted.  Posterior pharynx clear of any exudate or lesions.   Neck: normal, supple, no masses, no thyromegaly.  No evidence of jugular venous distension.   Respiratory: clear to auscultation bilaterally, no wheezing, no crackles. Normal respiratory effort. No accessory muscle use.  Cardiovascular: Regular rate and rhythm, no murmurs / rubs / gallops. No extremity edema. 2+ pedal pulses. No carotid bruits.  Chest:   Nontender without crepitus or deformity.   Back:   Nontender without crepitus or deformity. Abdomen: Abdomen is soft and nontender.  No evidence of intra-abdominal masses.  Positive bowel sounds noted in all quadrants.   Musculoskeletal: No joint deformity upper and lower extremities. Good ROM, no contractures. Normal muscle tone.  Neurologic: CN 2-12 grossly intact. Sensation intact.  Patient moving all 4 extremities spontaneously.  Patient is following all commands.  Patient is responsive to verbal stimuli.   Psychiatric: Patient exhibits normal mood with appropriate affect.  Patient seems to possess insight as to their current situation.    Data Reviewed:  I have personally reviewed and interpreted labs, imaging.  Significant findings are:  Chemistry revealing sodium 138, potassium 3.2, chloride 103, bicarbonate 25, BUN 39, creatinine 2.23 CBC revealing white blood cell count of 8.1, hemoglobin of 14.6, hematocrit of 44.7, platelet count of 160 Urinalysis revealing small blood and 100 protein Noncontrast CT imaging of the head  negative for acute intracranial pathology  EKG: Personally reviewed.  Rhythm is normal sinus rhythm with heart rate of 67 bpm.  Notable T wave inversions in the inferior and lateral leads.  No dynamic ST segment changes appreciated.   Assessment and Plan: * AKI (acute kidney injury) (HCC) Patient exhibiting evidence of acute kidney injury most likely secondary to volume depletion however chronic NSAID use is possibly contributing factor Creatinine is currently 2.23 an increase compared to baseline of 1.39 Hydrating patient with intravenous isotonic fluids. Strict input and output monitoring Monitoring renal function and electrolytes with serial chemistries Avoiding nephrotoxic agents if at all possible And obtaining renal ultrasound Obtain urine electrolytes, obtaining urinalysis, obtaining urine toxicology screen Advising patient to take daily Tylenol instead for chronic pain   Hypokalemia Replacing with potassium chloride Evaluating for concurrent hypomagnesemia  Monitoring potassium levels with serial chemistries.   Type 2 diabetes mellitus without complication, without long-term current use of insulin (HCC) Documented history of diabetes although patient does not take any medications Patient been placed on Accu-Cheks before every meal and nightly with sliding scale insulin Hemoglobin A1C ordered Diabetic Diet   Elevated blood-pressure reading, without diagnosis of hypertension Somewhat elevated blood pressures here although patient does not have a documented history of hypertension nor does he take any antihypertensives We will initiate  oral antihypertensive therapy if hypertension persists and is clinically indicated As needed intravenous antihypertensives for markedly elevated blood pressures  Nicotine dependence, cigarettes, uncomplicated Counseling on cessation daily       Code Status:  Full code  code status decision has been confirmed with: patient Family  Communication: deferred   Consults: None  Severity of Illness:  The appropriate patient status for this patient is OBSERVATION. Observation status is judged to be reasonable and necessary in order to provide the required intensity of service to ensure the patient's safety. The patient's presenting symptoms, physical exam findings, and initial radiographic and laboratory data in the context of their medical condition is felt to place them at decreased risk for further clinical deterioration. Furthermore, it is anticipated that the patient will be medically stable for discharge from the hospital within 2 midnights of admission.   Author:  Marinda Elk MD  05/24/2022 9:37 PM

## 2022-05-24 NOTE — ED Notes (Signed)
ED Provider at bedside. 

## 2022-05-24 NOTE — Assessment & Plan Note (Signed)
·   Replacing with potassium chloride °· Evaluating for concurrent hypomagnesemia  °· Monitoring potassium levels with serial chemistries. ° °

## 2022-05-25 LAB — COMPREHENSIVE METABOLIC PANEL
ALT: 25 U/L (ref 0–44)
AST: 21 U/L (ref 15–41)
Albumin: 3.6 g/dL (ref 3.5–5.0)
Alkaline Phosphatase: 63 U/L (ref 38–126)
Anion gap: 7 (ref 5–15)
BUN: 29 mg/dL — ABNORMAL HIGH (ref 6–20)
CO2: 22 mmol/L (ref 22–32)
Calcium: 8.3 mg/dL — ABNORMAL LOW (ref 8.9–10.3)
Chloride: 110 mmol/L (ref 98–111)
Creatinine, Ser: 1.83 mg/dL — ABNORMAL HIGH (ref 0.61–1.24)
GFR, Estimated: 43 mL/min — ABNORMAL LOW (ref >60)
Glucose, Bld: 111 mg/dL — ABNORMAL HIGH (ref 70–99)
Potassium: 4 mmol/L (ref 3.5–5.1)
Sodium: 139 mmol/L (ref 135–145)
Total Bilirubin: 1.9 mg/dL — ABNORMAL HIGH (ref 0.3–1.2)
Total Protein: 7 g/dL (ref 6.5–8.1)

## 2022-05-25 LAB — CBC WITH DIFFERENTIAL/PLATELET
Abs Immature Granulocytes: 0.03 10*3/uL (ref 0.00–0.07)
Basophils Absolute: 0.1 10*3/uL (ref 0.0–0.1)
Basophils Relative: 1 %
Eosinophils Absolute: 0.2 10*3/uL (ref 0.0–0.5)
Eosinophils Relative: 3 %
HCT: 42.6 % (ref 39.0–52.0)
Hemoglobin: 14.4 g/dL (ref 13.0–17.0)
Immature Granulocytes: 0 %
Lymphocytes Relative: 29 %
Lymphs Abs: 2.1 10*3/uL (ref 0.7–4.0)
MCH: 30.6 pg (ref 26.0–34.0)
MCHC: 33.8 g/dL (ref 30.0–36.0)
MCV: 90.6 fL (ref 80.0–100.0)
Monocytes Absolute: 0.5 10*3/uL (ref 0.1–1.0)
Monocytes Relative: 7 %
Neutro Abs: 4.5 10*3/uL (ref 1.7–7.7)
Neutrophils Relative %: 60 %
Platelets: 153 10*3/uL (ref 150–400)
RBC: 4.7 MIL/uL (ref 4.22–5.81)
RDW: 12.7 % (ref 11.5–15.5)
WBC: 7.4 10*3/uL (ref 4.0–10.5)
nRBC: 0 % (ref 0.0–0.2)

## 2022-05-25 LAB — LACTIC ACID, PLASMA: Lactic Acid, Venous: 1.5 mmol/L (ref 0.5–1.9)

## 2022-05-25 LAB — HEMOGLOBIN A1C
Hgb A1c MFr Bld: 6.5 % — ABNORMAL HIGH (ref 4.8–5.6)
Mean Plasma Glucose: 139.85 mg/dL

## 2022-05-25 LAB — GLUCOSE, CAPILLARY: Glucose-Capillary: 112 mg/dL — ABNORMAL HIGH (ref 70–99)

## 2022-05-25 LAB — HIV ANTIBODY (ROUTINE TESTING W REFLEX): HIV Screen 4th Generation wRfx: NONREACTIVE

## 2022-05-25 LAB — MAGNESIUM: Magnesium: 2.2 mg/dL (ref 1.7–2.4)

## 2022-05-25 NOTE — Hospital Course (Signed)
56 year old male with documented history of diabetes mellitus (but doesn't take meds), nicotine dependence who presents to [facility] emergency department with complaints of headache generalized malaise and "feeling hot." Found to have ARF, admitted for IVF

## 2022-05-25 NOTE — Progress Notes (Signed)
Pt states he has to leave to go his brother's funeral in [place] and can't wait for labs. States if he starts feeling bad again he will go the a hospital there. MD notified. AMA form signed and placed in chart.

## 2022-05-25 NOTE — Discharge Summary (Signed)
Physician Discharge Summary   Patient: Scott Byrd MRN: [id number] DOB: 1966/07/08  Admit date:     05/24/2022  Discharge date: 05/25/22  Discharge Physician: Rickey Barbara   PCP: Patient, No Pcp Per   Patient Left Against Medical Advice  Discharge Diagnoses: Principal Problem:   AKI (acute kidney injury) [facility] Active Problems:   Hypokalemia   Type 2 diabetes mellitus without complication, without long-term current use of insulin (HCC)   Elevated blood-pressure reading, without diagnosis of hypertension   Nicotine dependence, cigarettes, uncomplicated  Resolved Problems:   * No resolved hospital problems. *  Hospital Course: 56 year old male with documented history of diabetes mellitus (but doesn't take meds), nicotine dependence who presents to [facility] emergency department with complaints of headache generalized malaise and "feeling hot." Found to have ARF, admitted for IVF  Assessment and Plan: * AKI (acute kidney injury) (HCC) Patient exhibiting evidence of acute kidney injury most likely secondary to volume depletion however chronic NSAID use is possibly contributing factor Creatinine is currently 2.23 an increase compared to baseline of 1.39 Given IVF with improvement in renal function Renal US reviewed, reported to pt through interpreter regarding renal cyst with recs for f/u MRI Before results of repeat renal function returned, pt elected to leave AMA. Pt understood risk for leaving prematurely     Hypokalemia replaced     Type 2 diabetes mellitus without complication, without long-term current use of insulin (HCC) Documented history of diabetes although patient does not take any medications A1c 6.5     Elevated blood-pressure reading, without diagnosis of hypertension Somewhat elevated blood pressures here although patient does not have a documented history of hypertension nor does he take any antihypertensives   Nicotine dependence, cigarettes,  uncomplicated Counseled on cessation daily        Consultants:  Procedures performed:   Left AMA  Discharge Exam: Filed Weights   05/24/22 2305  Weight: 96 kg   Left AMA  Condition at discharge:  Left AMA  The results of significant diagnostics from this hospitalization (including imaging, microbiology, ancillary and laboratory) are listed below for reference.   Imaging Studies: US RENAL  Result Date: 05/24/2022 CLINICAL DATA:  Renal failure. EXAM: RENAL / URINARY TRACT ULTRASOUND COMPLETE COMPARISON:  None Available. FINDINGS: Right Kidney: Renal measurements: 13.8 x 7.1 x 8.1 = volume: 411 mL. No hydronephrosis. Increased echogenicity. Multilevel pole shadowing calculi identified measuring up to 9 mm. There is a simple cyst in the mid right kidney measuring 1.3 x 1.3 x 1.2 cm. Within the superior pole there is a 4.8 x 4.3 x 3.4 cm complex cystic structure with internal septations and debris. Left Kidney: Renal measurements: 10.2 x 5.6 x 4.7 cm = volume: 114 mL. No hydronephrosis. Increased echogenicity. There is a calculus in the superior pole measuring 11 mm. Two simple cysts are identified. One is measuring 1.4 x 1.2 x 1.2 cm in the mid kidney in the other is 1.2 x 1.1 x 1.0 cm in the inferior pole. Bladder: Appears normal for degree of bladder distention. Other: None. IMPRESSION: 1. No hydronephrosis. 2. Echogenic kidneys likely related to medical renal disease. 3. Complex cystic structure in the superior pole the right kidney measuring 4.8 cm. Recommend further evaluation with renal MRI. Additional smaller simple renal cysts. Electronically Signed   By: Darliss Cheney M.D.   On: 05/24/2022 23:13   DG Chest 1 View  Result Date: 05/24/2022 CLINICAL DATA:  Weakness and dehydration EXAM: PORTABLE CHEST 1 VIEW  COMPARISON:  08/16/2011 FINDINGS: The heart size and mediastinal contours are within normal limits. Both lungs are clear. The visualized skeletal structures are unremarkable.  IMPRESSION: No active disease. Electronically Signed   By: Alcide Clever M.D.   On: 05/24/2022 21:48   CT Head Wo Contrast  Result Date: 05/24/2022 CLINICAL DATA:  Headache. EXAM: CT HEAD WITHOUT CONTRAST TECHNIQUE: Contiguous axial images were obtained from the base of the skull through the vertex without intravenous contrast. RADIATION DOSE REDUCTION: This exam was performed according to the departmental dose-optimization program which includes automated exposure control, adjustment of the mA and/or kV according to patient size and/or use of iterative reconstruction technique. 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 No subdural, epidural, or subarachnoid hemorrhage. No other acute abnormalities. Vascular: No hyperdense vessel or unexpected calcification. Skull: Normal. Negative for fracture or focal lesion. Sinuses/Orbits: No acute finding. Other: None. IMPRESSION: No acute intracranial abnormalities. Electronically Signed   By: Gerome Sam III M.D.   On: 05/24/2022 18:04    Microbiology: Results for orders placed or performed during the hospital encounter of 05/24/22  SARS Coronavirus 2 by RT PCR (hospital order, performed in [facility] hospital lab) *cepheid single result test*     Status: None   Collection Time: 05/24/22  9:20 PM  Result Value Ref Range Status   SARS Coronavirus 2 by RT PCR NEGATIVE NEGATIVE Final    Comment: (NOTE) SARS-CoV-2 target nucleic acids are NOT DETECTED.  The SARS-CoV-2 RNA is generally detectable in upper and lower respiratory specimens during the acute phase of infection. The lowest concentration of SARS-CoV-2 viral copies this assay can detect is 250 copies / mL. A negative result does not preclude SARS-CoV-2 infection and should not be used as the sole basis for treatment or other patient management decisions.  A negative  result may occur with improper specimen collection / handling, submission of specimen other than nasopharyngeal swab, presence of viral mutation(s) within the areas targeted by this assay, and inadequate number of viral copies (<250 copies / mL). A negative result must be combined with clinical observations, patient history, and epidemiological information.  Fact Sheet for Patients:   RoadLapTop.co.za  Fact Sheet for Healthcare Providers: http://kim-miller.com/  This test is not yet approved or  cleared by the [place] FDA and has been authorized for detection and/or diagnosis of SARS-CoV-2 by FDA under an Emergency Use Authorization (EUA).  This EUA will remain in effect (meaning this test can be used) for the duration of the COVID-19 declaration under Section 564(b)(1) of the Act, 21 U.S.C. section 360bbb-3(b)(1), unless the authorization is terminated or revoked sooner.  Performed at [facility], [address] [place], [place] [address]     Labs: CBC: Recent Labs  Lab 05/24/22 1510 05/25/22 0727  WBC 8.1 7.4  NEUTROABS  --  4.5  HGB 14.6 14.4  HCT 44.7 42.6  MCV 92.7 90.6  PLT 160 153   Basic Metabolic Panel: Recent Labs  Lab 05/24/22 1510 05/24/22 1703 05/25/22 0727  NA 138 138 139  K 3.4* 3.2* 4.0  CL 102 103 110  CO2 25 25 22   GLUCOSE 92 120* 111*  BUN 41* 39* 29*  CREATININE 2.19* 2.23* 1.83*  CALCIUM 8.8* 8.8* 8.3*  MG  --  2.6* 2.2   Liver Function Tests: Recent Labs  Lab 05/24/22 1510 05/25/22 0727  AST 28 21  ALT 29 25  ALKPHOS 68 63  BILITOT 0.9 1.9*  PROT 7.9 7.0  ALBUMIN 4.0 3.6   CBG: Recent Labs  Lab 05/24/22 2311 05/25/22 0725  GLUCAP 119* 112*    Signed: Rickey Barbara, MD [facility] 05/25/2022

## 2022-05-26 LAB — UREA NITROGEN, URINE: Urea Nitrogen, Ur: 919 mg/dL

## 2024-05-01 ENCOUNTER — Inpatient Hospital Stay (HOSPITAL_COMMUNITY)
Admission: EM | Admit: 2024-05-01 | Discharge: 2024-05-04 | DRG: 305 | Disposition: A | Discharge status: Home / Self Care | Payer: MEDICAID | Attending: Internal Medicine | Admitting: Internal Medicine

## 2024-05-01 ENCOUNTER — Other Ambulatory Visit: Payer: Self-pay

## 2024-05-01 ENCOUNTER — Emergency Department (HOSPITAL_COMMUNITY): Payer: MEDICAID

## 2024-05-01 ENCOUNTER — Encounter (HOSPITAL_COMMUNITY): Payer: Self-pay

## 2024-05-01 DIAGNOSIS — I160 Hypertensive urgency: Principal | ICD-10-CM | POA: Diagnosis present

## 2024-05-01 DIAGNOSIS — N189 Chronic kidney disease, unspecified: Secondary | ICD-10-CM | POA: Diagnosis present

## 2024-05-01 DIAGNOSIS — N1832 Chronic kidney disease, stage 3b: Secondary | ICD-10-CM | POA: Diagnosis present

## 2024-05-01 DIAGNOSIS — I161 Hypertensive emergency: Principal | ICD-10-CM

## 2024-05-01 DIAGNOSIS — E119 Type 2 diabetes mellitus without complications: Secondary | ICD-10-CM

## 2024-05-01 DIAGNOSIS — Z79899 Other long term (current) drug therapy: Secondary | ICD-10-CM

## 2024-05-01 DIAGNOSIS — Z87891 Personal history of nicotine dependence: Secondary | ICD-10-CM

## 2024-05-01 DIAGNOSIS — E1122 Type 2 diabetes mellitus with diabetic chronic kidney disease: Secondary | ICD-10-CM | POA: Diagnosis present

## 2024-05-01 DIAGNOSIS — I130 Hypertensive heart and chronic kidney disease with heart failure and stage 1 through stage 4 chronic kidney disease, or unspecified chronic kidney disease: Secondary | ICD-10-CM | POA: Diagnosis present

## 2024-05-01 DIAGNOSIS — Z603 Acculturation difficulty: Secondary | ICD-10-CM | POA: Diagnosis present

## 2024-05-01 DIAGNOSIS — R519 Headache, unspecified: Secondary | ICD-10-CM

## 2024-05-01 DIAGNOSIS — I5032 Chronic diastolic (congestive) heart failure: Secondary | ICD-10-CM | POA: Diagnosis present

## 2024-05-01 DIAGNOSIS — E860 Dehydration: Secondary | ICD-10-CM | POA: Diagnosis present

## 2024-05-01 DIAGNOSIS — R42 Dizziness and giddiness: Secondary | ICD-10-CM

## 2024-05-01 DIAGNOSIS — N179 Acute kidney failure, unspecified: Secondary | ICD-10-CM | POA: Diagnosis present

## 2024-05-01 LAB — CBC
HCT: 37 % — ABNORMAL LOW (ref 39.0–52.0)
Hemoglobin: 12.3 g/dL — ABNORMAL LOW (ref 13.0–17.0)
MCH: 30.1 pg (ref 26.0–34.0)
MCHC: 33.2 g/dL (ref 30.0–36.0)
MCV: 90.7 fL (ref 80.0–100.0)
Platelets: 166 K/uL (ref 150–400)
RBC: 4.08 MIL/uL — ABNORMAL LOW (ref 4.22–5.81)
RDW: 13.1 % (ref 11.5–15.5)
WBC: 7.9 K/uL (ref 4.0–10.5)
nRBC: 0 % (ref 0.0–0.2)

## 2024-05-01 LAB — URINALYSIS, ROUTINE W REFLEX MICROSCOPIC
Bacteria, UA: NONE SEEN
Bilirubin Urine: NEGATIVE
Glucose, UA: NEGATIVE mg/dL
Ketones, ur: NEGATIVE mg/dL
Leukocytes,Ua: NEGATIVE
Nitrite: NEGATIVE
Protein, ur: 100 mg/dL — AB
Specific Gravity, Urine: 1.013 (ref 1.005–1.030)
pH: 5 (ref 5.0–8.0)

## 2024-05-01 LAB — COMPREHENSIVE METABOLIC PANEL WITH GFR
ALT: 28 U/L (ref 0–44)
AST: 21 U/L (ref 15–41)
Albumin: 3.7 g/dL (ref 3.5–5.0)
Alkaline Phosphatase: 65 U/L (ref 38–126)
Anion gap: 10 (ref 5–15)
BUN: 40 mg/dL — ABNORMAL HIGH (ref 6–20)
CO2: 17 mmol/L — ABNORMAL LOW (ref 22–32)
Calcium: 8.9 mg/dL (ref 8.9–10.3)
Chloride: 111 mmol/L (ref 98–111)
Creatinine, Ser: 2.82 mg/dL — ABNORMAL HIGH (ref 0.61–1.24)
GFR, Estimated: 25 mL/min — ABNORMAL LOW (ref >60)
Glucose, Bld: 156 mg/dL — ABNORMAL HIGH (ref 70–99)
Potassium: 3.9 mmol/L (ref 3.5–5.1)
Sodium: 138 mmol/L (ref 135–145)
Total Bilirubin: 0.6 mg/dL (ref 0.0–1.2)
Total Protein: 7.6 g/dL (ref 6.5–8.1)

## 2024-05-01 LAB — CBG MONITORING, ED
Glucose-Capillary: 137 mg/dL — ABNORMAL HIGH (ref 70–99)
Glucose-Capillary: 128 mg/dL — ABNORMAL HIGH (ref 70–99)

## 2024-05-01 LAB — SODIUM, URINE, RANDOM: Sodium, Ur: 86 mmol/L

## 2024-05-01 LAB — CREATININE, URINE, RANDOM: Creatinine, Urine: 100 mg/dL

## 2024-05-01 MED ORDER — DIPHENHYDRAMINE HCL 50 MG/ML IJ SOLN
12.5000 mg | Freq: Once | INTRAMUSCULAR | Status: AC
  Administered 2024-05-01: 12.5 mg via INTRAVENOUS
  Filled 2024-05-01: qty 1

## 2024-05-01 MED ORDER — LACTATED RINGERS IV SOLN: INTRAVENOUS | Status: AC

## 2024-05-01 MED ORDER — ONDANSETRON HCL 4 MG PO TABS
4.0000 mg | ORAL_TABLET | Freq: Four times a day (QID) | ORAL | Status: DC | PRN
  Administered 2024-05-02: 4 mg via ORAL
  Filled 2024-05-01: qty 1

## 2024-05-01 MED ORDER — PROCHLORPERAZINE EDISYLATE 10 MG/2ML IJ SOLN
10.0000 mg | Freq: Once | INTRAMUSCULAR | Status: AC
  Administered 2024-05-01: 10 mg via INTRAVENOUS
  Filled 2024-05-01: qty 2

## 2024-05-01 MED ORDER — SENNOSIDES-DOCUSATE SODIUM 8.6-50 MG PO TABS: 1.0000 | ORAL_TABLET | Freq: Every evening | ORAL | Status: DC | PRN

## 2024-05-01 MED ORDER — AMLODIPINE BESYLATE 5 MG PO TABS
5.0000 mg | ORAL_TABLET | Freq: Every day | ORAL | Status: DC
  Administered 2024-05-01 – 2024-05-02 (×2): 5 mg via ORAL
  Filled 2024-05-01 (×2): qty 1

## 2024-05-01 MED ORDER — BISACODYL 5 MG PO TBEC: 5.0000 mg | DELAYED_RELEASE_TABLET | Freq: Every day | ORAL | Status: DC | PRN

## 2024-05-01 MED ORDER — HYDRALAZINE HCL 20 MG/ML IJ SOLN
10.0000 mg | INTRAMUSCULAR | Status: DC | PRN
  Administered 2024-05-01: 10 mg via INTRAVENOUS
  Filled 2024-05-01: qty 1

## 2024-05-01 MED ORDER — ONDANSETRON 4 MG PO TBDP
4.0000 mg | ORAL_TABLET | Freq: Once | ORAL | Status: AC
  Administered 2024-05-01: 4 mg via ORAL
  Filled 2024-05-01: qty 1

## 2024-05-01 MED ORDER — ACETAMINOPHEN 325 MG PO TABS
650.0000 mg | ORAL_TABLET | Freq: Four times a day (QID) | ORAL | Status: DC | PRN
  Administered 2024-05-02 – 2024-05-04 (×6): 650 mg via ORAL
  Filled 2024-05-01 (×6): qty 2

## 2024-05-01 MED ORDER — INSULIN ASPART 100 UNIT/ML IJ SOLN: 0.0000 [IU] | Freq: Every day | INTRAMUSCULAR | Status: DC

## 2024-05-01 MED ORDER — ACETAMINOPHEN 650 MG RE SUPP: 650.0000 mg | Freq: Four times a day (QID) | RECTAL | Status: DC | PRN

## 2024-05-01 MED ORDER — LACTATED RINGERS IV BOLUS
1000.0000 mL | Freq: Once | INTRAVENOUS | Status: AC
  Administered 2024-05-01: 1000 mL via INTRAVENOUS

## 2024-05-01 MED ORDER — MORPHINE SULFATE (PF) 4 MG/ML IV SOLN
4.0000 mg | Freq: Once | INTRAVENOUS | Status: AC
  Administered 2024-05-01: 4 mg via INTRAVENOUS
  Filled 2024-05-01: qty 1

## 2024-05-01 MED ORDER — HYDRALAZINE HCL 20 MG/ML IJ SOLN
10.0000 mg | INTRAMUSCULAR | Status: DC | PRN
  Administered 2024-05-01 – 2024-05-03 (×4): 10 mg via INTRAVENOUS
  Filled 2024-05-01 (×4): qty 1

## 2024-05-01 MED ORDER — HYDRALAZINE HCL 20 MG/ML IJ SOLN: 10.0000 mg | Freq: Once | INTRAMUSCULAR | Status: DC

## 2024-05-01 MED ORDER — INSULIN ASPART 100 UNIT/ML IJ SOLN
0.0000 [IU] | Freq: Three times a day (TID) | INTRAMUSCULAR | Status: DC
  Administered 2024-05-02: 1 [IU] via SUBCUTANEOUS
  Administered 2024-05-02: 2 [IU] via SUBCUTANEOUS
  Administered 2024-05-03: 1 [IU] via SUBCUTANEOUS
  Administered 2024-05-04: 3 [IU] via SUBCUTANEOUS

## 2024-05-01 MED ORDER — SODIUM CHLORIDE 0.9% FLUSH
3.0000 mL | Freq: Two times a day (BID) | INTRAVENOUS | Status: DC
  Administered 2024-05-01 – 2024-05-04 (×6): 3 mL via INTRAVENOUS

## 2024-05-01 MED ORDER — HEPARIN SODIUM (PORCINE) 5000 UNIT/ML IJ SOLN
5000.0000 [IU] | Freq: Three times a day (TID) | INTRAMUSCULAR | Status: DC
  Administered 2024-05-01 – 2024-05-04 (×8): 5000 [IU] via SUBCUTANEOUS
  Filled 2024-05-01 (×8): qty 1

## 2024-05-01 MED ORDER — ONDANSETRON HCL 4 MG/2ML IJ SOLN
4.0000 mg | Freq: Four times a day (QID) | INTRAMUSCULAR | Status: DC | PRN
  Administered 2024-05-04: 4 mg via INTRAVENOUS
  Filled 2024-05-01: qty 2

## 2024-05-01 NOTE — Hospital Course (Signed)
 Scott Byrd is a Spanish-speaking 58 y.o. male with medical history significant for T2DM, HTN, CKD stage IIIb, vertigo who is admitted with AKI on CKD stage IIIb.

## 2024-05-01 NOTE — ED Notes (Signed)
 Spanish interpreter used via Nutritional therapist.

## 2024-05-01 NOTE — ED Provider Notes (Signed)
 [place] EMERGENCY DEPARTMENT AT [facility] Provider Note   CSN: [id number] Arrival date & time: 05/01/24  1347     Patient presents with: Headache   Scott Byrd is a 58 y.o. male.    Headache    Patient presents to the ED for evaluation of acute headache dizziness vomiting.  Patient has history of diabetes headache and vertigo.  Patient states since about 6 this morning he has been having trouble with headache as well as dizziness and vomiting.  His symptoms get worse when he is lying flat.  Patient denies any trouble with vision.  No trouble with his speech.  He is not have any focal numbness or weakness.  Patient states he does have a history of headaches in the past.  He has never been diagnosed with migraines.  Prior to Admission medications   Not on File    Allergies: Patient has no known allergies.    Review of Systems  Neurological:  Positive for headaches.    Updated Vital Signs BP (!) 208/111 (BP Location: Right Arm)   Pulse 67   Temp 98.8 F (37.1 C)   Resp 20   Ht 1.676 m (5' 6)   Wt 96 kg   SpO2 94%   BMI 34.16 kg/m   Physical Exam Vitals and nursing note reviewed.  Constitutional:      Appearance: He is well-developed. He is ill-appearing.  HENT:     Head: Normocephalic and atraumatic.     Right Ear: External ear normal.     Left Ear: External ear normal.  Eyes:     General: No scleral icterus.       Right eye: No discharge.        Left eye: No discharge.     Conjunctiva/sclera: Conjunctivae normal.  Neck:     Trachea: No tracheal deviation.  Cardiovascular:     Rate and Rhythm: Normal rate and regular rhythm.  Pulmonary:     Effort: Pulmonary effort is normal. No respiratory distress.     Breath sounds: Normal breath sounds. No stridor. No wheezing or rales.  Abdominal:     General: Bowel sounds are normal. There is no distension.     Palpations: Abdomen is soft.     Tenderness: There is no abdominal tenderness. There  is no guarding or rebound.  Musculoskeletal:        General: No tenderness or deformity.     Cervical back: Neck supple.  Skin:    General: Skin is warm and dry.     Findings: No rash.  Neurological:     General: No focal deficit present.     Mental Status: He is alert.     GCS: GCS eye subscore is 4. GCS verbal subscore is 5. GCS motor subscore is 6.     Cranial Nerves: No cranial nerve deficit, dysarthria or facial asymmetry.     Sensory: No sensory deficit.     Motor: No weakness, abnormal muscle tone or seizure activity.  Psychiatric:        Mood and Affect: Mood normal.     (all labs ordered are listed, but only abnormal results are displayed) Labs Reviewed  CBC - Abnormal; Notable for the following components:      Result Value   RBC 4.08 (*)    Hemoglobin 12.3 (*)    HCT 37.0 (*)    All other components within normal limits  CBG MONITORING, ED - Abnormal; Notable for  the following components:   Glucose-Capillary 137 (*)    All other components within normal limits  COMPREHENSIVE METABOLIC PANEL WITH GFR    EKG: None  Radiology: No results found.   Procedures   Medications Ordered in the ED  prochlorperazine  (COMPAZINE ) injection 10 mg (has no administration in time range)  diphenhydrAMINE  (BENADRYL ) injection 12.5 mg (has no administration in time range)  morphine  (PF) 4 MG/ML injection 4 mg (has no administration in time range)  ondansetron  (ZOFRAN -ODT) disintegrating tablet 4 mg (4 mg Oral Given 05/01/24 1418)    Clinical Course as of 05/01/24 1459  Sun May 01, 2024  1459 Comprehensive metabolic panel(!) Creatinine elevated similar to previous.  CBC normal. [JK]    Clinical Course User Index [JK] Randol Simmonds, MD                                 Medical Decision Making Amount and/or Complexity of Data Reviewed Labs: ordered. Radiology: ordered.  Risk Prescription drug management.   Patient presents emergency room with acute vomiting dizziness  headache.  Patient also notably hypertensive in the ED.  Concerned about the possibility of cerebral hemorrhage, hypertensive emergency, stroke, complex migraine.  Will proceed with medications for his headache.  Labs and CT scan of his head also ordered.  Care turned over to oncoming MD at shift change.     Final diagnoses:  None    ED Discharge Orders     None          Randol Simmonds, MD 05/01/24 1452

## 2024-05-01 NOTE — ED Triage Notes (Addendum)
 Pt states he has been vomiting, woke up dizzy with a headache. Pt states he is tingling all over. Pt states the room is spinning. Pt denies weakness.  NIH=0.  Pt denies any injury.

## 2024-05-01 NOTE — H&P (Signed)
 History and Physical    Scott Byrd FMW:[id number] DOB: 1966/07/06 DOA: 05/01/2024  PCP: Patient, No Pcp Per  Patient coming from: Home  I have personally briefly reviewed patient's old medical records in [facility]  Chief Complaint: Headache, nausea, vomiting, dizziness  HPI: Scott Byrd is a Spanish-speaking 58 y.o. male with medical history significant for T2DM, HTN, CKD stage IIIb, vertigo who does not take any medications and presents to the ED for evaluation of headache, nausea, vomiting, and dizziness.  Remote video interpreter utilized to assist with interview.  Patient states he woke up this morning with frontal headache, nausea, and vomiting.  He has had episodes of dizziness described as room spinning sensation which he says has since resolved.  He still has mild headache.  Patient states he does not take any medications other than ibuprofen .  He has not had any chest pain, dyspnea, abdominal pain.  He reports good urine output without dysuria.  He says he has noticed some swelling in his legs a couple times in the past but none currently.  He says he quit smoking 3 months ago.  He says he drinks 1 beer a night.  Patient does not have routine medical care.  He says he does not have a PCP.  Patient states that he is unwilling to stay more than 1 day in the hospital otherwise he will lose his job.  ED Course  Labs/Imaging on admission: I have personally reviewed following labs and imaging studies.  Initial vitals showed BP 208/111, pulse 67, RR 20, temp 98.8 F, SpO2 94% on room air.  Labs showed sodium 138, potassium 3.9, bicarb 17, BUN 40, creatinine 2.82 (1.83 on 05/25/2022), serum glucose 156, LFTs within normal limits, WBC 7.9, hemoglobin 12.3, platelets 166.  CT head without contrast negative for acute intracranial pathology.  Unchanged configuration of the posterior fossa with prominent CSF space superior to the cerebellum, possibly related to the presence  of an arachnoid cyst.  Patient was given IV Compazine  and Benadryl , IV morphine .  The hospitalist service was consulted to admit.  Review of Systems: All systems reviewed and are negative except as documented in history of present illness above.   Past Medical History:  Diagnosis Date   Diabetes mellitus    Headache(784.0)    Vertigo     History reviewed. No pertinent surgical history.  Social History: He says he quit smoking 3 months ago.  He says he drinks 1 beer a night.  No Known Allergies  History reviewed. No pertinent family history.   Prior to Admission medications   Medication Sig Start Date End Date Taking? Authorizing Provider  ibuprofen  (ADVIL ) 200 MG tablet Take 400 mg by mouth every 6 (six) hours as needed for moderate pain (pain score 4-6).   Yes [provider]    Physical Exam: Vitals:   05/01/24 1930 05/01/24 2000 05/01/24 2015 05/01/24 2030  BP: (!) 219/99 (!) 201/89 (!) 186/83 (!) 174/85  Pulse: 79 71  65  Resp: 20 15  14   Temp:      SpO2: 98% 96%  97%  Weight:      Height:       Constitutional: Resting bed, NAD, calm, comfortable Eyes: EOMI, lids and conjunctivae normal ENMT: Mucous membranes are moist. Posterior pharynx clear of any exudate or lesions.Normal dentition.  Neck: normal, supple, no masses. Respiratory: clear to auscultation bilaterally, no wheezing, no crackles. Normal respiratory effort. No accessory muscle use.  Cardiovascular: Regular rate  and rhythm, no murmurs / rubs / gallops. No extremity edema. 2+ pedal pulses. Abdomen: no tenderness, no masses palpated. Musculoskeletal: no clubbing / cyanosis. No joint deformity upper and lower extremities. Good ROM, no contractures. Normal muscle tone.  Skin: no rashes, lesions, ulcers. No induration Neurologic: Sensation intact. Strength 5/5 in all 4.  Psychiatric: Alert and oriented x 3. Normal mood.   EKG: Personally reviewed. Sinus rhythm, rate 66, LVH, no acute ischemic  changes.  Assessment/Plan Principal Problem:   Acute kidney injury superimposed on chronic kidney disease (HCC) Active Problems:   Type 2 diabetes mellitus (HCC)   Hypertensive urgency   Dizziness   Scott Byrd is a Spanish-speaking 58 y.o. male with medical history significant for T2DM, HTN, CKD stage IIIb, vertigo who is admitted with AKI on CKD stage IIIb.  Assessment and Plan: Acute kidney injury superimposed on CKD stage IIIb: Review of prior labs suggest patient likely has underlying CKD stage IIIb.  Creatinine is 2.82 on admission with previous 1.83 in July 2023. - Start IV fluid hydration - Obtain urine studies - Patient counseled to avoid NSAIDs  Hypertensive urgency: Variable blood pressure while in the ED, highest being 219/99.  Not on antihypertensives as an outpatient. - Start amlodipine  5 mg daily - IV hydralazine  as needed  Type 2 diabetes: Placed on SSI.  Dizziness: Described as room spinning sensation.  Has resolved by time of admission.  CT head was negative for acute intracranial pathology.  There was note of unchanged configuration of posterior fossa with prominent CSF space superior to the cerebellum possibly related to presence of an arachnoid cyst.   DVT prophylaxis: heparin  injection 5,000 Units Start: 05/01/24 2200 Code Status: Full code Family Communication: Wife and sister-in-law at bedside Disposition Plan: From home, dispo pending clinical progress Consults called: None Severity of Illness: The appropriate patient status for this patient is OBSERVATION. Observation status is judged to be reasonable and necessary in order to provide the required intensity of service to ensure the patient's safety. The patient's presenting symptoms, physical exam findings, and initial radiographic and laboratory data in the context of their medical condition is felt to place them at decreased risk for further clinical deterioration. Furthermore, it is anticipated  that the patient will be medically stable for discharge from the hospital within 2 midnights of admission.   Jorie Blanch MD [facility]  If 7PM-7AM, please contact night-coverage [email or web address]  05/01/2024, 9:03 PM

## 2024-05-02 LAB — BASIC METABOLIC PANEL WITH GFR
Anion gap: 9 (ref 5–15)
BUN: 36 mg/dL — ABNORMAL HIGH (ref 6–20)
CO2: 20 mmol/L — ABNORMAL LOW (ref 22–32)
Calcium: 8.6 mg/dL — ABNORMAL LOW (ref 8.9–10.3)
Chloride: 111 mmol/L (ref 98–111)
Creatinine, Ser: 2.53 mg/dL — ABNORMAL HIGH (ref 0.61–1.24)
GFR, Estimated: 29 mL/min — ABNORMAL LOW (ref >60)
Glucose, Bld: 115 mg/dL — ABNORMAL HIGH (ref 70–99)
Potassium: 3.9 mmol/L (ref 3.5–5.1)
Sodium: 140 mmol/L (ref 135–145)

## 2024-05-02 LAB — HEMOGLOBIN A1C
Hgb A1c MFr Bld: 7 % — ABNORMAL HIGH (ref 4.8–5.6)
Mean Plasma Glucose: 154.2 mg/dL

## 2024-05-02 LAB — CBC
HCT: 35.7 % — ABNORMAL LOW (ref 39.0–52.0)
Hemoglobin: 11.6 g/dL — ABNORMAL LOW (ref 13.0–17.0)
MCH: 29.3 pg (ref 26.0–34.0)
MCHC: 32.5 g/dL (ref 30.0–36.0)
MCV: 90.2 fL (ref 80.0–100.0)
Platelets: 167 K/uL (ref 150–400)
RBC: 3.96 MIL/uL — ABNORMAL LOW (ref 4.22–5.81)
RDW: 13.2 % (ref 11.5–15.5)
WBC: 8.8 K/uL (ref 4.0–10.5)
nRBC: 0 % (ref 0.0–0.2)

## 2024-05-02 LAB — GLUCOSE, CAPILLARY
Glucose-Capillary: 148 mg/dL — ABNORMAL HIGH (ref 70–99)
Glucose-Capillary: 165 mg/dL — ABNORMAL HIGH (ref 70–99)
Glucose-Capillary: 111 mg/dL — ABNORMAL HIGH (ref 70–99)

## 2024-05-02 LAB — HIV ANTIBODY (ROUTINE TESTING W REFLEX): HIV Screen 4th Generation wRfx: NONREACTIVE

## 2024-05-02 LAB — CBG MONITORING, ED: Glucose-Capillary: 104 mg/dL — ABNORMAL HIGH (ref 70–99)

## 2024-05-02 MED ORDER — HYDRALAZINE HCL 25 MG PO TABS
25.0000 mg | ORAL_TABLET | Freq: Three times a day (TID) | ORAL | Status: DC
  Administered 2024-05-02 – 2024-05-03 (×4): 25 mg via ORAL
  Filled 2024-05-02 (×4): qty 1

## 2024-05-02 MED ORDER — AMLODIPINE BESYLATE 10 MG PO TABS
10.0000 mg | ORAL_TABLET | Freq: Every day | ORAL | Status: DC
  Administered 2024-05-03 – 2024-05-04 (×2): 10 mg via ORAL
  Filled 2024-05-02 (×2): qty 1

## 2024-05-02 MED ORDER — SODIUM CHLORIDE 0.9 % IV SOLN: INTRAVENOUS | Status: DC

## 2024-05-02 NOTE — ED Notes (Signed)
 Transfer of care report given to [facility], [place].

## 2024-05-02 NOTE — Progress Notes (Signed)
 PROGRESS NOTE    Scott Byrd  FMW:[id number] DOB: October 08, 1966 DOA: 05/01/2024 PCP: Patient, No Pcp Per   Brief Narrative:   58 y.o. male with medical history significant for T2DM, HTN, CKD stage IIIb, vertigo, presented with headache, nausea and dizziness, found to have AKI on CKD IIIb and uncontrolled HTN.  Assessment & Plan:  Principal Problem:   Acute kidney injury superimposed on chronic kidney disease (HCC) Active Problems:   Type 2 diabetes mellitus (HCC)   Hypertensive urgency   Dizziness    Acute kidney injury superimposed on CKD stage IIIb:Improving, likely prerenal in the setting of dehydration and uncontrolled HTN. -Creatinine was 2.82 on admission with previous 1.83 in July 2023. -Cr today is 2.5 - Continue with IVF - f/u urine studies - Avoid NSAIDs or any nephrotoxic medications   Hypertensive urgency,POA: - Continue with amlodipine  10 mg daily and hydralazine  25 mg PO TID - IV hydralazine  as needed for SBP > 160 mmHg   Type 2 diabetes mellitus,POA: Placed on SSI. A1c is 7%   Dizziness: CT head was negative for acute intracranial pathology.  There was note of unchanged configuration of posterior fossa with prominent CSF space superior to the cerebellum possibly related to presence of an arachnoid cyst. Likely in the setting of uncontrolled HTN and dehydration/AKI.  Disposition: Home  DVT prophylaxis: heparin  injection 5,000 Units Start: 05/01/24 2200     Code Status: Full Code Family Communication:  Daughter and wife at bedside Status is: Inpatient Remains inpatient appropriate because: AKI,HTN urgency   Subjective:  Dizziness has improved. He wanted to know when he can go home and he is worried about his job. Wife and daughter present at the bedside.   Examination:  General exam: Appears calm and comfortable  Respiratory system: Clear to auscultation. Respiratory effort normal. Cardiovascular system: S1 & S2 heard, RRR. No JVD, murmurs,  rubs, gallops or clicks. No pedal edema. Gastrointestinal system: Abdomen is nondistended, soft and nontender. No organomegaly or masses felt. Normal bowel sounds heard. Central nervous system: Alert and oriented. No focal neurological deficits. Extremities: Symmetric 5 x 5 power. Skin: No rashes, lesions or ulcers Psychiatry: Judgement and insight appear normal. Mood & affect appropriate.      Diet Orders (From admission, onward)     Start     Ordered   05/01/24 2020  Diet heart healthy/carb modified Fluid consistency: Thin  Diet effective now       Question:  Fluid consistency:  Answer:  Thin   05/01/24 2019            Objective: Vitals:   05/02/24 0750 05/02/24 0838 05/02/24 0842 05/02/24 1207  BP: (!) 193/98 (!) 174/79 (!) 162/72 (!) 178/82  Pulse:  83  80  Resp:  14  18  Temp:  97.8 F (36.6 C)  97.8 F (36.6 C)  TempSrc:  Oral  Oral  SpO2:  97%    Weight:      Height:       No intake or output data in the 24 hours ending 05/02/24 1225 Filed Weights   05/01/24 1403  Weight: 96 kg    Scheduled Meds:  [START ON 05/03/2024] amLODipine   10 mg Oral Daily   heparin   5,000 Units Subcutaneous Q8H   hydrALAZINE   25 mg Oral Q8H   insulin  aspart  0-5 Units Subcutaneous QHS   insulin  aspart  0-9 Units Subcutaneous TID WC   sodium chloride  flush  3 mL Intravenous Q12H  Continuous Infusions:  sodium chloride       Nutritional status     Body mass index is 34.16 kg/m.  Data Reviewed:   CBC: Recent Labs  Lab 05/01/24 1410 05/02/24 0340  WBC 7.9 8.8  HGB 12.3* 11.6*  HCT 37.0* 35.7*  MCV 90.7 90.2  PLT 166 167   Basic Metabolic Panel: Recent Labs  Lab 05/01/24 1410 05/02/24 0340  NA 138 140  K 3.9 3.9  CL 111 111  CO2 17* 20*  GLUCOSE 156* 115*  BUN 40* 36*  CREATININE 2.82* 2.53*  CALCIUM 8.9 8.6*   GFR: Estimated Creatinine Clearance: 34.9 mL/min (A) (by C-G formula based on SCr of 2.53 mg/dL (H)). Liver Function Tests: Recent Labs  Lab  05/01/24 1410  AST 21  ALT 28  ALKPHOS 65  BILITOT 0.6  PROT 7.6  ALBUMIN 3.7   No results for input(s): LIPASE, AMYLASE in the last 168 hours. No results for input(s): AMMONIA in the last 168 hours. Coagulation Profile: No results for input(s): INR, PROTIME in the last 168 hours. Cardiac Enzymes: No results for input(s): CKTOTAL, CKMB, CKMBINDEX, TROPONINI in the last 168 hours. BNP (last 3 results) No results for input(s): PROBNP in the last 8760 hours. HbA1C: Recent Labs    05/02/24 0340  HGBA1C 7.0*   CBG: Recent Labs  Lab 05/01/24 1444 05/01/24 2106 05/02/24 0753 05/02/24 1212  GLUCAP 137* 128* 104* 148*   Lipid Profile: No results for input(s): CHOL, HDL, LDLCALC, TRIG, CHOLHDL, LDLDIRECT in the last 72 hours. Thyroid Function Tests: No results for input(s): TSH, T4TOTAL, FREET4, T3FREE, THYROIDAB in the last 72 hours. Anemia Panel: No results for input(s): VITAMINB12, FOLATE, FERRITIN, TIBC, IRON, RETICCTPCT in the last 72 hours. Sepsis Labs: No results for input(s): PROCALCITON, LATICACIDVEN in the last 168 hours.  No results found for this or any previous visit (from the past 240 hours).       Radiology Studies: CT Head Wo Contrast Result Date: 05/01/2024 CLINICAL DATA:  Headache, vomiting, dizziness EXAM: CT HEAD WITHOUT CONTRAST TECHNIQUE: Contiguous axial images were obtained from the base of the skull through the vertex without intravenous contrast. RADIATION DOSE REDUCTION: This exam was performed according to the departmental dose-optimization program which includes automated exposure control, adjustment of the mA and/or kV according to patient size and/or use of iterative reconstruction technique. COMPARISON:  05/24/2022 FINDINGS: Brain: No evidence of acute infarction, hemorrhage, hydrocephalus, extra-axial collection or mass lesion/mass effect. Unchanged configuration of the posterior fossa  with prominent CSF space superior to the cerebellum, possibly related to the presence of an arachnoid cyst. Vascular: No hyperdense vessel or unexpected calcification. Skull: Normal. Negative for fracture or focal lesion. Sinuses/Orbits: No acute finding. Other: None. IMPRESSION: 1. No acute intracranial pathology. 2. Unchanged configuration of the posterior fossa with prominent CSF space superior to the cerebellum, possibly related to the presence of an arachnoid cyst. Electronically Signed   By: Marolyn JONETTA Jaksch M.D.   On: 05/01/2024 16:01        LOS: 0 days   Time spent= 42 mins    Deliliah Room, MD [facility]  If 7PM-7AM, please contact night-coverage  05/02/2024, 12:25 PM

## 2024-05-02 NOTE — Plan of Care (Signed)

## 2024-05-03 ENCOUNTER — Inpatient Hospital Stay (HOSPITAL_COMMUNITY): Payer: MEDICAID

## 2024-05-03 DIAGNOSIS — I517 Cardiomegaly: Secondary | ICD-10-CM

## 2024-05-03 LAB — CBC WITH DIFFERENTIAL/PLATELET
Abs Immature Granulocytes: 0.03 K/uL (ref 0.00–0.07)
Basophils Absolute: 0 K/uL (ref 0.0–0.1)
Basophils Relative: 1 %
Eosinophils Absolute: 0.2 K/uL (ref 0.0–0.5)
Eosinophils Relative: 2 %
HCT: 34.3 % — ABNORMAL LOW (ref 39.0–52.0)
Hemoglobin: 11.5 g/dL — ABNORMAL LOW (ref 13.0–17.0)
Immature Granulocytes: 0 %
Lymphocytes Relative: 27 %
Lymphs Abs: 2.1 K/uL (ref 0.7–4.0)
MCH: 30.5 pg (ref 26.0–34.0)
MCHC: 33.5 g/dL (ref 30.0–36.0)
MCV: 91 fL (ref 80.0–100.0)
Monocytes Absolute: 0.6 K/uL (ref 0.1–1.0)
Monocytes Relative: 8 %
Neutro Abs: 5 K/uL (ref 1.7–7.7)
Neutrophils Relative %: 62 %
Platelets: 166 K/uL (ref 150–400)
RBC: 3.77 MIL/uL — ABNORMAL LOW (ref 4.22–5.81)
RDW: 13.4 % (ref 11.5–15.5)
WBC: 8 K/uL (ref 4.0–10.5)
nRBC: 0 % (ref 0.0–0.2)

## 2024-05-03 LAB — BASIC METABOLIC PANEL WITH GFR
Anion gap: 8 (ref 5–15)
BUN: 33 mg/dL — ABNORMAL HIGH (ref 6–20)
CO2: 22 mmol/L (ref 22–32)
Calcium: 8.7 mg/dL — ABNORMAL LOW (ref 8.9–10.3)
Chloride: 110 mmol/L (ref 98–111)
Creatinine, Ser: 2.44 mg/dL — ABNORMAL HIGH (ref 0.61–1.24)
GFR, Estimated: 30 mL/min — ABNORMAL LOW (ref >60)
Glucose, Bld: 98 mg/dL (ref 70–99)
Potassium: 3.9 mmol/L (ref 3.5–5.1)
Sodium: 140 mmol/L (ref 135–145)

## 2024-05-03 LAB — ECHOCARDIOGRAM COMPLETE
Area-P 1/2: 4.06 cm2
Height: 66 in
S' Lateral: 2.8 cm
Weight: 3386.27 [oz_av]

## 2024-05-03 LAB — GLUCOSE, CAPILLARY
Glucose-Capillary: 108 mg/dL — ABNORMAL HIGH (ref 70–99)
Glucose-Capillary: 110 mg/dL — ABNORMAL HIGH (ref 70–99)
Glucose-Capillary: 135 mg/dL — ABNORMAL HIGH (ref 70–99)
Glucose-Capillary: 144 mg/dL — ABNORMAL HIGH (ref 70–99)

## 2024-05-03 MED ORDER — HYDRALAZINE HCL 20 MG/ML IJ SOLN
10.0000 mg | Freq: Four times a day (QID) | INTRAMUSCULAR | Status: DC | PRN
  Administered 2024-05-03 – 2024-05-04 (×2): 10 mg via INTRAVENOUS
  Filled 2024-05-03 (×2): qty 1

## 2024-05-03 MED ORDER — HYDRALAZINE HCL 50 MG PO TABS
50.0000 mg | ORAL_TABLET | Freq: Three times a day (TID) | ORAL | Status: DC
  Administered 2024-05-03 – 2024-05-04 (×4): 50 mg via ORAL
  Filled 2024-05-03 (×4): qty 1

## 2024-05-03 MED ORDER — MECLIZINE HCL 25 MG PO TABS
12.5000 mg | ORAL_TABLET | Freq: Two times a day (BID) | ORAL | Status: DC | PRN
  Administered 2024-05-03: 12.5 mg via ORAL
  Filled 2024-05-03: qty 1

## 2024-05-03 NOTE — Progress Notes (Signed)
 PROGRESS NOTE    Scott Byrd  FMW:[id number] DOB: [birth year] DOA: 05/01/2024 PCP: Patient, No Pcp Per   Brief Narrative:   58 y.o. male with medical history significant for T2DM, HTN, CKD stage IIIb, vertigo, presented with headache, nausea and dizziness, found to have AKI on CKD IIIb and uncontrolled HTN. Started on BP meds, received IVF.  Assessment & Plan:  Principal Problem:   Acute kidney injury superimposed on chronic kidney disease (HCC) Active Problems:   Type 2 diabetes mellitus (HCC)   Hypertensive urgency   Dizziness    Acute kidney injury superimposed on CKD stage IIIb:Improving, likely prerenal in the setting of dehydration and uncontrolled HTN. -Creatinine was 2.82 on admission with previous 1.83 in July 2023. -Cr today is 2.44 - Continue with IVF - f/u urine studies - Avoid NSAIDs or any nephrotoxic medications -Needs out patient follow up with a nephrologist.   Hypertensive urgency,POA: Not on any bp meds at home. - Continue with amlodipine  10 mg daily and hydralazine  50 mg PO TID. Adjusting meds for better bp control - IV hydralazine  as needed for SBP > 160 mmHg - Low salt diet. - ECHO ordered as he does have LVH on EKG in the setting of uncontrolled BP.   Type 2 diabetes mellitus,POA: Placed on SSI. A1c is 7%   Dizziness: CT head was negative for acute intracranial pathology.  There was note of unchanged configuration of posterior fossa with prominent CSF space superior to the cerebellum possibly related to presence of an arachnoid cyst. Likely in the setting of uncontrolled HTN and dehydration/AKI.  F/u MRI brain to look for evidence of acute ischemic stroke in the setting of uncontrolled BP.  Disposition: Home  DVT prophylaxis: heparin  injection 5,000 Units Start: 05/01/24 2200     Code Status: Full Code Family Communication:  Daughter and wife at bedside Status is: Inpatient Remains inpatient appropriate because: AKI,HTN  urgency   Subjective:  He said that he still feels dizzy.  He wants to take a shower.  Spanish interpreter was used for the entirety of the conversation.  Patient's daughter and wife were also present at the bedside.  I explained to him that we are adjusting his blood pressure medications to achieve better control of his blood pressure and once we get his blood pressure under control, his dizziness and headache will improve.  He understood the entire conversation.   Examination:  General exam: Appears calm and comfortable  Respiratory system: Clear to auscultation. Respiratory effort normal. Cardiovascular system: S1 & S2 heard, RRR. No JVD, murmurs, rubs, gallops or clicks. No pedal edema. Gastrointestinal system: Abdomen is nondistended, soft and nontender. No organomegaly or masses felt. Normal bowel sounds heard. Central nervous system: Alert and oriented. No focal neurological deficits. Extremities: Symmetric 5 x 5 power. Skin: No rashes, lesions or ulcers Psychiatry: Judgement and insight appear normal. Mood & affect appropriate.      Diet Orders (From admission, onward)     Start     Ordered   05/01/24 2020  Diet heart healthy/carb modified Fluid consistency: Thin  Diet effective now       Question:  Fluid consistency:  Answer:  Thin   05/01/24 2019            Objective: Vitals:   05/02/24 2319 05/03/24 0546 05/03/24 0808 05/03/24 0906  BP: (!) 173/69 (!) 179/90 (!) 186/88 (!) 175/86  Pulse: 77 69 73 80  Resp: 18 18    Temp: 98 F (36.7  C) 98.2 F (36.8 C) 98 F (36.7 C)   TempSrc: Oral Oral Oral   SpO2: 97% 97% 97%   Weight:      Height:        Intake/Output Summary (Last 24 hours) at 05/03/2024 1036 Last data filed at 05/03/2024 0600 Gross per 24 hour  Intake 723 ml  Output --  Net 723 ml   Filed Weights   05/01/24 1403  Weight: 96 kg    Scheduled Meds:  amLODipine   10 mg Oral Daily   heparin   5,000 Units Subcutaneous Q8H   hydrALAZINE   50 mg  Oral Q8H   insulin  aspart  0-5 Units Subcutaneous QHS   insulin  aspart  0-9 Units Subcutaneous TID WC   sodium chloride  flush  3 mL Intravenous Q12H   Continuous Infusions:    Nutritional status     Body mass index is 34.16 kg/m.  Data Reviewed:   CBC: Recent Labs  Lab 05/01/24 1410 05/02/24 0340 05/03/24 0620  WBC 7.9 8.8 8.0  NEUTROABS  --   --  5.0  HGB 12.3* 11.6* 11.5*  HCT 37.0* 35.7* 34.3*  MCV 90.7 90.2 91.0  PLT 166 167 166   Basic Metabolic Panel: Recent Labs  Lab 05/01/24 1410 05/02/24 0340 05/03/24 0620  NA 138 140 140  K 3.9 3.9 3.9  CL 111 111 110  CO2 17* 20* 22  GLUCOSE 156* 115* 98  BUN 40* 36* 33*  CREATININE 2.82* 2.53* 2.44*  CALCIUM 8.9 8.6* 8.7*   GFR: Estimated Creatinine Clearance: 36.2 mL/min (A) (by C-G formula based on SCr of 2.44 mg/dL (H)). Liver Function Tests: Recent Labs  Lab 05/01/24 1410  AST 21  ALT 28  ALKPHOS 65  BILITOT 0.6  PROT 7.6  ALBUMIN 3.7   No results for input(s): LIPASE, AMYLASE in the last 168 hours. No results for input(s): AMMONIA in the last 168 hours. Coagulation Profile: No results for input(s): INR, PROTIME in the last 168 hours. Cardiac Enzymes: No results for input(s): CKTOTAL, CKMB, CKMBINDEX, TROPONINI in the last 168 hours. BNP (last 3 results) No results for input(s): PROBNP in the last 8760 hours. HbA1C: Recent Labs    05/02/24 0340  HGBA1C 7.0*   CBG: Recent Labs  Lab 05/02/24 0753 05/02/24 1212 05/02/24 1603 05/02/24 2100 05/03/24 0809  GLUCAP 104* 148* 165* 111* 108*   Lipid Profile: No results for input(s): CHOL, HDL, LDLCALC, TRIG, CHOLHDL, LDLDIRECT in the last 72 hours. Thyroid Function Tests: No results for input(s): TSH, T4TOTAL, FREET4, T3FREE, THYROIDAB in the last 72 hours. Anemia Panel: No results for input(s): VITAMINB12, FOLATE, FERRITIN, TIBC, IRON, RETICCTPCT in the last 72 hours. Sepsis Labs: No  results for input(s): PROCALCITON, LATICACIDVEN in the last 168 hours.  No results found for this or any previous visit (from the past 240 hours).      Radiology Studies: CT Head Wo Contrast Result Date: 05/01/2024 CLINICAL DATA:  Headache, vomiting, dizziness EXAM: CT HEAD WITHOUT CONTRAST TECHNIQUE: Contiguous axial images were obtained from the base of the skull through the vertex without intravenous contrast. RADIATION DOSE REDUCTION: This exam was performed according to the departmental dose-optimization program which includes automated exposure control, adjustment of the mA and/or kV according to patient size and/or use of iterative reconstruction technique. COMPARISON:  05/24/2022 FINDINGS: Brain: No evidence of acute infarction, hemorrhage, hydrocephalus, extra-axial collection or mass lesion/mass effect. Unchanged configuration of the posterior fossa with prominent CSF space superior to the cerebellum, possibly related to  the presence of an arachnoid cyst. Vascular: No hyperdense vessel or unexpected calcification. Skull: Normal. Negative for fracture or focal lesion. Sinuses/Orbits: No acute finding. Other: None. IMPRESSION: 1. No acute intracranial pathology. 2. Unchanged configuration of the posterior fossa with prominent CSF space superior to the cerebellum, possibly related to the presence of an arachnoid cyst. Electronically Signed   By: Marolyn JONETTA Jaksch M.D.   On: 05/01/2024 16:01      LOS: 1 day   Time spent= 42 mins    Deliliah Room, MD [facility]  If 7PM-7AM, please contact night-coverage  05/03/2024, 10:36 AM

## 2024-05-03 NOTE — Progress Notes (Signed)
 Transported via bed to MRI at this time.

## 2024-05-03 NOTE — Progress Notes (Signed)
  Echocardiogram 2D Echocardiogram has been performed.  Scott Byrd 05/03/2024, 4:28 PM

## 2024-05-03 NOTE — Progress Notes (Signed)
 AM assessment completed with assistance of Spanish Interpreter via [employer] iPad.  Interpreter Donnajean [phone]

## 2024-05-03 NOTE — Plan of Care (Signed)
 Alert and oriented.  Spanish interpreter utilized for interactions.  Medicated for high blood pressure and dizziness, see MAR for details.  Family at bedside intermittently.   Problem: Education: Goal: Ability to describe self-care measures that may prevent or decrease complications (Diabetes Survival Skills Education) will improve Outcome: Progressing Goal: Individualized Educational Video(s) Outcome: Progressing   Problem: Coping: Goal: Ability to adjust to condition or change in health will improve Outcome: Progressing   Problem: Fluid Volume: Goal: Ability to maintain a balanced intake and output will improve Outcome: Progressing   Problem: Health Behavior/Discharge Planning: Goal: Ability to identify and utilize available resources and services will improve Outcome: Progressing

## 2024-05-03 NOTE — Progress Notes (Signed)
 Arrived back to unit after MRI.

## 2024-05-03 NOTE — TOC Initial Note (Signed)
 Transition of Care [facility]) - Initial/Assessment Note    Patient Details  Name: Scott Byrd MRN: [id number] Date of Birth: [birth year]  Transition of Care [facility]) CM/SW Contact:    Sudie Erminio Deems, RN Phone Number: 05/03/2024, 12:14 PM  Clinical Narrative: Interpreter Shanda at the bedside during the visit. Patient presented for AKI. PTA patient wasted he was from home with spouse and daughter.  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 Patient in need of PCP- CMA to arrange an appointment and place on the AVS. Case Manager will follow for medication assistance with MATCH. MD is aware that the patient asked for a work note. No further needs identified at this time.    Expected Discharge Plan: Home/Self Care Barriers to Discharge: No Barriers Identified   Patient Goals and CMS Choice Patient states their goals for this hospitalization and ongoing recovery are:: Patient plans to return home with   Choice offered to / list presented to : NA      Expected Discharge Plan and Services In-house Referral: NA Discharge Planning Services: CM Consult, Follow-up appt scheduled, Medication Assistance, MATCH Program Post Acute Care Choice: NA Living arrangements for the past 2 months: Single Family Home                   DME Agency: NA       HH Arranged: NA          Prior Living Arrangements/Services Living arrangements for the past 2 months: Single Family Home Lives with:: Spouse Patient language and need for interpreter reviewed:: Yes (Interpreter in the room) Do you feel safe going back to the place where you live?: Yes      Need for Family Participation in Patient Care: No (Comment) Care giver support system in place?: No (comment)   Criminal Activity/Legal Involvement Pertinent to Current Situation/Hospitalization: No - Comment as needed  Activities of Daily Living   ADL Screening (condition at time of admission) Independently performs ADLs?: Yes  (appropriate for developmental age) Is the patient deaf or have difficulty hearing?: No Does the patient have difficulty seeing, even when wearing glasses/contacts?: No Does the patient have difficulty concentrating, remembering, or making decisions?: No  Permission Sought/Granted Permission sought to share information with : Family Supports, Case Manager                Emotional Assessment Appearance:: Appears stated age Attitude/Demeanor/Rapport: Engaged Affect (typically observed): Appropriate Orientation: : Oriented to Self, Oriented to Place, Oriented to  Time, Oriented to Situation Alcohol / Substance Use: Not Applicable Psych Involvement: No (comment)  Admission diagnosis:  Bad headache [R51.9] AKI (acute kidney injury) (HCC) [N17.9] Hypertensive emergency [I16.1] Acute kidney injury superimposed on chronic kidney disease (HCC) [N17.9, N18.9] Patient Active Problem List   Diagnosis Date Noted   Hypertensive urgency 05/01/2024   Dizziness 05/01/2024   Acute kidney injury superimposed on chronic kidney disease (HCC) 05/24/2022   Hypokalemia 05/24/2022   Elevated blood-pressure reading, without diagnosis of hypertension 05/24/2022   Type 2 diabetes mellitus (HCC) 05/24/2022   Nicotine dependence, cigarettes, uncomplicated 05/24/2022   PCP:  Patient, No Pcp Per Pharmacy:   [id number] - [place], [place] - [address] [facility] [address] Phone: [phone] Fax: [phone]     Social Drivers of Health (SDOH) Social History: SDOH Screenings   Food Insecurity: No Food Insecurity (05/02/2024)  Housing: Low Risk  (05/02/2024)  Transportation Needs: No Transportation Needs (05/02/2024)  Utilities: Not  At Risk (05/02/2024)  Tobacco Use: Medium Risk (05/01/2024)   SDOH Interventions:     Readmission Risk Interventions     No data to display

## 2024-05-04 ENCOUNTER — Other Ambulatory Visit (HOSPITAL_COMMUNITY): Payer: Self-pay

## 2024-05-04 DIAGNOSIS — I5032 Chronic diastolic (congestive) heart failure: Secondary | ICD-10-CM | POA: Insufficient documentation

## 2024-05-04 DIAGNOSIS — N1832 Chronic kidney disease, stage 3b: Secondary | ICD-10-CM | POA: Insufficient documentation

## 2024-05-04 DIAGNOSIS — I160 Hypertensive urgency: Secondary | ICD-10-CM

## 2024-05-04 LAB — BASIC METABOLIC PANEL WITH GFR
Anion gap: 12 (ref 5–15)
BUN: 33 mg/dL — ABNORMAL HIGH (ref 6–20)
CO2: 22 mmol/L (ref 22–32)
Calcium: 9.1 mg/dL (ref 8.9–10.3)
Chloride: 104 mmol/L (ref 98–111)
Creatinine, Ser: 2.38 mg/dL — ABNORMAL HIGH (ref 0.61–1.24)
GFR, Estimated: 31 mL/min — ABNORMAL LOW (ref >60)
Glucose, Bld: 124 mg/dL — ABNORMAL HIGH (ref 70–99)
Potassium: 4.2 mmol/L (ref 3.5–5.1)
Sodium: 138 mmol/L (ref 135–145)

## 2024-05-04 LAB — GLUCOSE, CAPILLARY
Glucose-Capillary: 120 mg/dL — ABNORMAL HIGH (ref 70–99)
Glucose-Capillary: 241 mg/dL — ABNORMAL HIGH (ref 70–99)

## 2024-05-04 LAB — PROTEIN / CREATININE RATIO, URINE
Creatinine, Urine: 102 mg/dL
Protein Creatinine Ratio: 1.39 mg/mg{creat} — ABNORMAL HIGH (ref 0.00–0.15)
Total Protein, Urine: 142 mg/dL

## 2024-05-04 MED ORDER — METOPROLOL TARTRATE 25 MG PO TABS
25.0000 mg | ORAL_TABLET | Freq: Two times a day (BID) | ORAL | Status: DC
  Administered 2024-05-04: 25 mg via ORAL
  Filled 2024-05-04: qty 1

## 2024-05-04 MED ORDER — NALOXONE HCL 0.4 MG/ML IJ SOLN: 0.4000 mg | INTRAMUSCULAR | Status: DC | PRN

## 2024-05-04 MED ORDER — HYDRALAZINE HCL 50 MG PO TABS
50.0000 mg | ORAL_TABLET | Freq: Three times a day (TID) | ORAL | 0 refills | Status: AC
  Filled 2024-05-04: qty 90, 30d supply, fill #0

## 2024-05-04 MED ORDER — LABETALOL HCL 5 MG/ML IV SOLN
5.0000 mg | INTRAVENOUS | Status: DC | PRN
  Administered 2024-05-04: 5 mg via INTRAVENOUS
  Filled 2024-05-04 (×2): qty 4

## 2024-05-04 MED ORDER — METOPROLOL TARTRATE 25 MG PO TABS
25.0000 mg | ORAL_TABLET | Freq: Two times a day (BID) | ORAL | 0 refills | Status: DC
  Filled 2024-05-04: qty 60, 30d supply, fill #0

## 2024-05-04 MED ORDER — AMLODIPINE BESYLATE 10 MG PO TABS
10.0000 mg | ORAL_TABLET | Freq: Every day | ORAL | 0 refills | Status: DC
  Filled 2024-05-04: qty 30, 30d supply, fill #0

## 2024-05-04 MED ORDER — OXYCODONE HCL 5 MG PO TABS
5.0000 mg | ORAL_TABLET | Freq: Once | ORAL | Status: AC | PRN
  Administered 2024-05-04: 5 mg via ORAL
  Filled 2024-05-04: qty 1

## 2024-05-04 MED ORDER — MORPHINE SULFATE (PF) 2 MG/ML IV SOLN: 1.0000 mg | Freq: Once | INTRAVENOUS | Status: DC | PRN

## 2024-05-04 NOTE — TOC Transition Note (Signed)
 Transition of Care [facility]) - Discharge Note   Patient Details  Name: Scott Byrd MRN: [id number] Date of Birth: 1965-11-24  Transition of Care [facility]) CM/SW Contact:  Sudie Erminio Deems, RN Phone Number: 05/04/2024, 12:38 PM   Clinical Narrative:  Patient will transition home today. MATCH Letter Entered for zero co pay.   MATCH MEDICATION ASSISTANCE CARD Pharmacies please call [phone] for claim processing assistance.  Rx BIN: [id number] Rx Group: [id number] Rx PCN: [id number] Relationship Code: 1 Person Code: [id number]  Patient ID (MRN): [id number]    Patient Name: [name]   Patient DOB: 1966-04-25   Discharge Date: 05-04-24   Expiration Date: 05-11-24  (must be filled within 7 days of discharge)       Final next level of care: Home/Self Care Barriers to Discharge: No Barriers Identified   Patient Goals and CMS Choice Patient states their goals for this hospitalization and ongoing recovery are:: Patient plans to return home with   Choice offered to / list presented to : NA      Discharge Placement                       Discharge Plan and Services Additional resources added to the After Visit Summary for   In-house Referral: NA Discharge Planning Services: CM Consult, Follow-up appt scheduled, Medication Assistance, MATCH Program Post Acute Care Choice: NA            DME Agency: NA       HH Arranged: NA          Social Drivers of Health (SDOH) Interventions SDOH Screenings   Food Insecurity: No Food Insecurity (05/02/2024)  Housing: Low Risk  (05/02/2024)  Transportation Needs: No Transportation Needs (05/02/2024)  Utilities: Not At Risk (05/02/2024)  Tobacco Use: Medium Risk (05/01/2024)     Readmission Risk Interventions     No data to display

## 2024-05-04 NOTE — Progress Notes (Signed)
 Discharge teaching complete. Meds, diet, activity, follow up appointments reviewed and all questions answered. Meds brought to bedside and handout given to patient. In person interpreter assisted with discharge. Patient walked out to car with wife and daughter by request.

## 2024-05-04 NOTE — Discharge Summary (Signed)
 Physician Discharge Summary  Tyheem Boughner FMW:[id number] DOB: 21-Mar-1966 DOA: 05/01/2024  PCP: Leavy Lucas Fox, PA-C  Admit date: 05/01/2024 Discharge date: 05/04/2024  Admitted From: Home Disposition:  Home  Recommendations for Outpatient Follow-up:  Follow up with PCP as scheduled 05/18/24 for blood pressure check Follow up for diabetes education and medical tx  Repeat BMP in 1 week to ensure stability of his creatinine Outpatient referral for nephrology for CKD   Discharge Condition: Stable  CODE STATUS: Full code Diet recommendation: Heart healthy  Brief/Interim Summary: Scott Byrd is a 58 y.o. male with medical history significant for T2DM, HTN, CKD stage IIIb, vertigo, presented with headache, nausea and dizziness, found to have AKI on CKD IIIb and uncontrolled HTN. Started on BP meds, received IVF.  His previous creatinine was 1.83 in July 2023.  His creatinine has improved and remained stable from 2.82-->2.38.  This may be his new baseline.  Follow-up outpatient for repeat BMP.  He will need continued adjustment in his blood pressure medications.  Discharge Diagnoses:   Principal Problem:   Hypertensive urgency Active Problems:   Acute kidney injury superimposed on chronic kidney disease (HCC)   Type 2 diabetes mellitus (HCC)   Dizziness   CKD stage 3b, GFR 30-44 ml/min (HCC)   Chronic diastolic CHF (congestive heart failure) [facility])   Discharge Instructions  Discharge Instructions     Ambulatory referral to Nephrology   Complete by: As directed    Call MD for:  difficulty breathing, headache or visual disturbances   Complete by: As directed    Call MD for:  extreme fatigue   Complete by: As directed    Call MD for:  persistant dizziness or light-headedness   Complete by: As directed    Call MD for:  persistant nausea and vomiting   Complete by: As directed    Call MD for:  severe uncontrolled pain   Complete by: As directed    Call MD for:   temperature >100.4   Complete by: As directed    Diet - low sodium heart healthy   Complete by: As directed    Discharge instructions   Complete by: As directed    You were cared for by a hospitalist during your hospital stay. If you have any questions about your discharge medications or the care you received while you were in the hospital after you are discharged, you can call the unit and ask to speak with the hospitalist on call if the hospitalist that took care of you is not available. Once you are discharged, your primary care physician will handle any further medical issues. Please note that NO REFILLS for any discharge medications will be authorized once you are discharged, as it is imperative that you return to your primary care physician (or establish a relationship with a primary care physician if you do not have one) for your aftercare needs so that they can reassess your need for medications and monitor your lab values.   Increase activity slowly   Complete by: As directed       Allergies as of 05/04/2024   No Known Allergies      Medication List     STOP taking these medications    ibuprofen  200 MG tablet Commonly known as: ADVIL        TAKE these medications    amLODipine  10 MG tablet Commonly known as: NORVASC  Take 1 tablet (10 mg total) by mouth daily. Start taking on: May 05, 2024  hydrALAZINE  50 MG tablet Commonly known as: APRESOLINE  Take 1 tablet (50 mg total) by mouth every 8 (eight) hours.   metoprolol  tartrate 25 MG tablet Commonly known as: LOPRESSOR  Take 1 tablet (25 mg total) by mouth 2 (two) times daily.        Follow-up Information     Lucas Sula DASEN, PA-C Follow up.   Specialty: Physician Assistant Why: TIME : 1:00 PM   PLEASE ARRIVE AT 12:30 PM DATE : JULY 23 , 2025  PLEASE BRING ALL CURRENT MEDICATION and ID Contact information: [address], [address] [place] [address] [phone]                No Known  Allergies   Procedures/Studies: MR BRAIN WO CONTRAST Result Date: 05/03/2024 CLINICAL DATA:  Initial evaluation for syncope/presyncope. EXAM: MRI HEAD WITHOUT CONTRAST TECHNIQUE: Multiplanar, multiecho pulse sequences of the brain and surrounding structures were obtained without intravenous contrast. COMPARISON:  CT from 05/01/2024 FINDINGS: Brain: Cerebral volume within normal limits. Patchy T2/FLAIR hyperintensity within the periventricular deep white matter both cerebral hemispheres, most like related chronic microvascular ischemic disease, mild for air age. No evidence for acute or subacute ischemia. Gray-white matter differentiation maintained. No areas of chronic cortical infarction. No acute intracranial hemorrhage. Single punctate chronic microhemorrhage noted at the anterior frontal lobe, of doubtful significance in isolation. Benign arachnoid cyst present at the suprasellar cistern. No other mass lesion, mass effect or midline shift. No hydrocephalus or extra-axial fluid collection. Pituitary gland within normal limits. Vascular: Major intracranial vascular flow voids are maintained. Skull and upper cervical spine: Craniocervical junction normal. Bone marrow signal intensity within normal limits. No scalp soft tissue abnormality. Sinuses/Orbits: Globes orbital soft tissues within normal limits. Scattered mucosal thickening present throughout the paranasal sinuses. No significant mastoid effusion. Other: None. IMPRESSION: 1. No acute intracranial abnormality. 2. Mild cerebral white matter disease, nonspecific, but most commonly related to chronic microvascular ischemic disease. 3. Benign arachnoid cyst at the supracerebellar cistern. Electronically Signed   By: Morene Hoard M.D.   On: 05/03/2024 19:18   ECHOCARDIOGRAM COMPLETE Result Date: 05/03/2024    ECHOCARDIOGRAM REPORT   Patient Name:   Scott Byrd Date of Exam: 05/03/2024 Medical Rec #:  [id number]          Height:       66.0 in  Accession #:    [id number]         Weight:       211.6 lb Date of Birth:  1966/03/09           BSA:          2.048 m Patient Age:    58 years           BP:           161/84 mmHg Patient Gender: M                  HR:           77 bpm. Exam Location:  Inpatient Procedure: 2D Echo (Both Spectral and Color Flow Doppler were utilized during            procedure). Indications:     left ventricular hypertrophy  History:         Patient has no prior history of Echocardiogram examinations.                  Chronic kidney disease; Risk Factors:Former Smoker,  Hypertension and Diabetes.  Sonographer:     Tinnie Barefoot RDCS Referring Phys:  [id number] QJMYJW RASHID Diagnosing Phys: Stanly Leavens MD  Sonographer Comments: Global longitudinal strain was attempted. IMPRESSIONS  1. Left ventricular ejection fraction, by estimation, is 65 to 70%. The left ventricle has normal function. The left ventricle has no regional wall motion abnormalities. There is severe asymmetric left ventricular hypertrophy of the basal-septal segment. Left ventricular diastolic parameters are consistent with Grade I diastolic dysfunction (impaired relaxation).  2. Right ventricular systolic function is normal. The right ventricular size is normal.  3. The mitral valve is normal in structure. No evidence of mitral valve regurgitation. No evidence of mitral stenosis.  4. The aortic valve is tricuspid. Aortic valve regurgitation is not visualized. No aortic stenosis is present.  5. The inferior vena cava is normal in size with greater than 50% respiratory variability, suggesting right atrial pressure of 3 mmHg. Comparison(s): No prior Echocardiogram. FINDINGS  Left Ventricle: Septal thickness 16 mm, no LVOT obstruction at rest. Left ventricular ejection fraction, by estimation, is 65 to 70%. The left ventricle has normal function. The left ventricle has no regional wall motion abnormalities. Strain was performed and the global  longitudinal strain is indeterminate. The left ventricular internal cavity size was normal in size. There is severe asymmetric left ventricular hypertrophy of the basal-septal segment. Left ventricular diastolic parameters are consistent with Grade I diastolic dysfunction (impaired relaxation). Right Ventricle: The right ventricular size is normal. No increase in right ventricular wall thickness. Right ventricular systolic function is normal. Left Atrium: Left atrial size was normal in size. Right Atrium: Right atrial size was normal in size. Pericardium: There is no evidence of pericardial effusion. Mitral Valve: The mitral valve is normal in structure. Mild mitral annular calcification. No evidence of mitral valve regurgitation. No evidence of mitral valve stenosis. Tricuspid Valve: The tricuspid valve is normal in structure. Tricuspid valve regurgitation is not demonstrated. No evidence of tricuspid stenosis. Aortic Valve: The aortic valve is tricuspid. Aortic valve regurgitation is not visualized. No aortic stenosis is present. Pulmonic Valve: The pulmonic valve was not well visualized. Pulmonic valve regurgitation is not visualized. No evidence of pulmonic stenosis. Aorta: The aortic root and ascending aorta are structurally normal, with no evidence of dilitation. Venous: The inferior vena cava is normal in size with greater than 50% respiratory variability, suggesting right atrial pressure of 3 mmHg. IAS/Shunts: The atrial septum is grossly normal.  LEFT VENTRICLE PLAX 2D LVIDd:         4.90 cm   Diastology LVIDs:         2.80 cm   LV e' medial:    7.62 cm/s LV PW:         1.60 cm   LV E/e' medial:  11.6 LV IVS:        1.30 cm   LV e' lateral:   8.81 cm/s LVOT diam:     2.10 cm   LV E/e' lateral: 10.0 LV SV:         92 LV SV Index:   45 LVOT Area:     3.46 cm  RIGHT VENTRICLE             IVC RV Basal diam:  2.80 cm     IVC diam: 1.50 cm RV S prime:     19.50 cm/s TAPSE (M-mode): 1.9 cm LEFT ATRIUM              Index  RIGHT ATRIUM           Index LA diam:        4.00 cm 1.95 cm/m   RA Area:     13.20 cm LA Vol (A2C):   69.6 ml 33.98 ml/m  RA Volume:   30.90 ml  15.08 ml/m LA Vol (A4C):   51.0 ml 24.90 ml/m LA Biplane Vol: 60.2 ml 29.39 ml/m  AORTIC VALVE LVOT Vmax:   139.00 cm/s LVOT Vmean:  89.700 cm/s LVOT VTI:    0.265 m  AORTA Ao Root diam: 3.60 cm Ao Asc diam:  3.60 cm MITRAL VALVE MV Area (PHT): 4.06 cm    SHUNTS MV Decel Time: 187 msec    Systemic VTI:  0.26 m MV E velocity: 88.20 cm/s  Systemic Diam: 2.10 cm MV A velocity: 74.50 cm/s MV E/A ratio:  1.18 Stanly Leavens MD Electronically signed by Stanly Leavens MD Signature Date/Time: 05/03/2024/4:57:42 PM    Final (Updated)    CT Head Wo Contrast Result Date: 05/01/2024 CLINICAL DATA:  Headache, vomiting, dizziness EXAM: CT HEAD WITHOUT CONTRAST TECHNIQUE: Contiguous axial images were obtained from the base of the skull through the vertex without intravenous contrast. RADIATION DOSE REDUCTION: This exam was performed according to the departmental dose-optimization program which includes automated exposure control, adjustment of the mA and/or kV according to patient size and/or use of iterative reconstruction technique. COMPARISON:  05/24/2022 FINDINGS: Brain: No evidence of acute infarction, hemorrhage, hydrocephalus, extra-axial collection or mass lesion/mass effect. Unchanged configuration of the posterior fossa with prominent CSF space superior to the cerebellum, possibly related to the presence of an arachnoid cyst. Vascular: No hyperdense vessel or unexpected calcification. Skull: Normal. Negative for fracture or focal lesion. Sinuses/Orbits: No acute finding. Other: None. IMPRESSION: 1. No acute intracranial pathology. 2. Unchanged configuration of the posterior fossa with prominent CSF space superior to the cerebellum, possibly related to the presence of an arachnoid cyst. Electronically Signed   By: Marolyn JONETTA Jaksch M.D.   On:  05/01/2024 16:01      Discharge Exam: Vitals:   05/04/24 0650 05/04/24 0731  BP: 137/79   Pulse:  71  Resp:  16  Temp:  98.4 F (36.9 C)  SpO2:  97%    General: Pt is alert, awake, not in acute distress Cardiovascular: RRR, S1/S2 +, no edema Respiratory: CTA bilaterally, no wheezing, no rhonchi, no respiratory distress, no conversational dyspnea  Abdominal: Soft, NT, ND, bowel sounds + Extremities: no edema, no cyanosis Psych: Normal mood and affect, stable judgement and insight     The results of significant diagnostics from this hospitalization (including imaging, microbiology, ancillary and laboratory) are listed below for reference.     Microbiology: No results found for this or any previous visit (from the past 240 hours).   Labs: BNP (last 3 results) No results for input(s): BNP in the last 8760 hours. Basic Metabolic Panel: Recent Labs  Lab 05/01/24 1410 05/02/24 0340 05/03/24 0620 05/04/24 0857  NA 138 140 140 138  K 3.9 3.9 3.9 4.2  CL 111 111 110 104  CO2 17* 20* 22 22  GLUCOSE 156* 115* 98 124*  BUN 40* 36* 33* 33*  CREATININE 2.82* 2.53* 2.44* 2.38*  CALCIUM 8.9 8.6* 8.7* 9.1   Liver Function Tests: Recent Labs  Lab 05/01/24 1410  AST 21  ALT 28  ALKPHOS 65  BILITOT 0.6  PROT 7.6  ALBUMIN 3.7   No results for input(s): LIPASE, AMYLASE in the last 168  hours. No results for input(s): AMMONIA in the last 168 hours. CBC: Recent Labs  Lab 05/01/24 1410 05/02/24 0340 05/03/24 0620  WBC 7.9 8.8 8.0  NEUTROABS  --   --  5.0  HGB 12.3* 11.6* 11.5*  HCT 37.0* 35.7* 34.3*  MCV 90.7 90.2 91.0  PLT 166 167 166   Cardiac Enzymes: No results for input(s): CKTOTAL, CKMB, CKMBINDEX, TROPONINI in the last 168 hours. BNP: Invalid input(s): POCBNP CBG: Recent Labs  Lab 05/03/24 0809 05/03/24 1219 05/03/24 1611 05/03/24 2127 05/04/24 0729  GLUCAP 108* 110* 135* 144* 120*   D-Dimer No results for input(s): DDIMER in  the last 72 hours. Hgb A1c Recent Labs    05/02/24 0340  HGBA1C 7.0*   Lipid Profile No results for input(s): CHOL, HDL, LDLCALC, TRIG, CHOLHDL, LDLDIRECT in the last 72 hours. Thyroid function studies No results for input(s): TSH, T4TOTAL, T3FREE, THYROIDAB in the last 72 hours.  Invalid input(s): FREET3 Anemia work up No results for input(s): VITAMINB12, FOLATE, FERRITIN, TIBC, IRON, RETICCTPCT in the last 72 hours. Urinalysis    Component Value Date/Time   COLORURINE YELLOW 05/01/2024 2110   APPEARANCEUR CLEAR 05/01/2024 2110   LABSPEC 1.013 05/01/2024 2110   PHURINE 5.0 05/01/2024 2110   GLUCOSEU NEGATIVE 05/01/2024 2110   HGBUR SMALL (A) 05/01/2024 2110   BILIRUBINUR NEGATIVE 05/01/2024 2110   KETONESUR NEGATIVE 05/01/2024 2110   PROTEINUR 100 (A) 05/01/2024 2110   UROBILINOGEN 0.2 11/06/2012 1235   NITRITE NEGATIVE 05/01/2024 2110   LEUKOCYTESUR NEGATIVE 05/01/2024 2110   Sepsis Labs Recent Labs  Lab 05/01/24 1410 05/02/24 0340 05/03/24 0620  WBC 7.9 8.8 8.0   Microbiology No results found for this or any previous visit (from the past 240 hours).   Patient was seen and examined on the day of discharge and was found to be in stable condition. Time coordinating discharge: 35 minutes including assessment and coordination of care, as well as examination of the patient.   SIGNED:  Delon Hoe, DO [facility] 05/04/2024, 11:53 AM

## 2024-05-06 ENCOUNTER — Emergency Department (HOSPITAL_COMMUNITY)
Admission: EM | Admit: 2024-05-06 | Discharge: 2024-05-06 | Disposition: A | Discharge status: Home / Self Care | Payer: Self-pay | Attending: Emergency Medicine | Admitting: Emergency Medicine

## 2024-05-06 ENCOUNTER — Encounter (HOSPITAL_COMMUNITY): Payer: Self-pay | Admitting: *Deleted

## 2024-05-06 ENCOUNTER — Emergency Department (HOSPITAL_COMMUNITY): Payer: Self-pay

## 2024-05-06 ENCOUNTER — Other Ambulatory Visit: Payer: Self-pay

## 2024-05-06 DIAGNOSIS — I509 Heart failure, unspecified: Secondary | ICD-10-CM | POA: Insufficient documentation

## 2024-05-06 DIAGNOSIS — R519 Headache, unspecified: Secondary | ICD-10-CM | POA: Insufficient documentation

## 2024-05-06 DIAGNOSIS — I130 Hypertensive heart and chronic kidney disease with heart failure and stage 1 through stage 4 chronic kidney disease, or unspecified chronic kidney disease: Secondary | ICD-10-CM | POA: Insufficient documentation

## 2024-05-06 DIAGNOSIS — N183 Chronic kidney disease, stage 3 unspecified: Secondary | ICD-10-CM | POA: Insufficient documentation

## 2024-05-06 DIAGNOSIS — R7401 Elevation of levels of liver transaminase levels: Secondary | ICD-10-CM | POA: Insufficient documentation

## 2024-05-06 DIAGNOSIS — Z79899 Other long term (current) drug therapy: Secondary | ICD-10-CM | POA: Insufficient documentation

## 2024-05-06 DIAGNOSIS — E1122 Type 2 diabetes mellitus with diabetic chronic kidney disease: Secondary | ICD-10-CM | POA: Insufficient documentation

## 2024-05-06 DIAGNOSIS — R7989 Other specified abnormal findings of blood chemistry: Secondary | ICD-10-CM | POA: Insufficient documentation

## 2024-05-06 LAB — COMPREHENSIVE METABOLIC PANEL WITH GFR
ALT: 65 U/L — ABNORMAL HIGH (ref 0–44)
AST: 46 U/L — ABNORMAL HIGH (ref 15–41)
Albumin: 3.9 g/dL (ref 3.5–5.0)
Alkaline Phosphatase: 63 U/L (ref 38–126)
Anion gap: 10 (ref 5–15)
BUN: 33 mg/dL — ABNORMAL HIGH (ref 6–20)
CO2: 23 mmol/L (ref 22–32)
Calcium: 8.9 mg/dL (ref 8.9–10.3)
Chloride: 101 mmol/L (ref 98–111)
Creatinine, Ser: 2.55 mg/dL — ABNORMAL HIGH (ref 0.61–1.24)
GFR, Estimated: 29 mL/min — ABNORMAL LOW (ref >60)
Glucose, Bld: 137 mg/dL — ABNORMAL HIGH (ref 70–99)
Potassium: 4.7 mmol/L (ref 3.5–5.1)
Sodium: 134 mmol/L — ABNORMAL LOW (ref 135–145)
Total Bilirubin: 0.9 mg/dL (ref 0.0–1.2)
Total Protein: 7.4 g/dL (ref 6.5–8.1)

## 2024-05-06 LAB — URINALYSIS, ROUTINE W REFLEX MICROSCOPIC
Bacteria, UA: NONE SEEN
Bilirubin Urine: NEGATIVE
Glucose, UA: NEGATIVE mg/dL
Hgb urine dipstick: NEGATIVE
Ketones, ur: NEGATIVE mg/dL
Leukocytes,Ua: NEGATIVE
Nitrite: NEGATIVE
Protein, ur: 100 mg/dL — AB
Specific Gravity, Urine: 1.008 (ref 1.005–1.030)
pH: 6 (ref 5.0–8.0)

## 2024-05-06 LAB — CBC
HCT: 37.2 % — ABNORMAL LOW (ref 39.0–52.0)
Hemoglobin: 12 g/dL — ABNORMAL LOW (ref 13.0–17.0)
MCH: 29.7 pg (ref 26.0–34.0)
MCHC: 32.3 g/dL (ref 30.0–36.0)
MCV: 92.1 fL (ref 80.0–100.0)
Platelets: 175 K/uL (ref 150–400)
RBC: 4.04 MIL/uL — ABNORMAL LOW (ref 4.22–5.81)
RDW: 13.2 % (ref 11.5–15.5)
WBC: 7.8 K/uL (ref 4.0–10.5)
nRBC: 0 % (ref 0.0–0.2)

## 2024-05-06 LAB — LIPASE, BLOOD: Lipase: 53 U/L — ABNORMAL HIGH (ref 11–51)

## 2024-05-06 MED ORDER — METOCLOPRAMIDE HCL 5 MG/ML IJ SOLN
10.0000 mg | Freq: Once | INTRAMUSCULAR | Status: AC
  Administered 2024-05-06: 10 mg via INTRAVENOUS
  Filled 2024-05-06: qty 2

## 2024-05-06 MED ORDER — DIPHENHYDRAMINE HCL 50 MG/ML IJ SOLN
12.5000 mg | Freq: Once | INTRAMUSCULAR | Status: AC
  Administered 2024-05-06: 12.5 mg via INTRAVENOUS
  Filled 2024-05-06: qty 1

## 2024-05-06 MED ORDER — ACETAMINOPHEN 500 MG PO TABS
1000.0000 mg | ORAL_TABLET | Freq: Once | ORAL | Status: AC
Start: 2024-05-06 — End: 2024-05-06
  Administered 2024-05-06: 1000 mg via ORAL
  Filled 2024-05-06: qty 2

## 2024-05-06 NOTE — ED Provider Notes (Signed)
 [place] [facility]GENCY DEPARTMENT AT [facility] Provider Note   CSN: [id number] Arrival date & time: 05/06/24  1525     Patient presents with: Headache and Fever  HPI Scott Byrd is a 58 y.o. male with DM2, CKD stage III, hypertension, CHF presenting for headache.  Started about 4 days ago.  Pain primarily concentrated in the forehead.  Was discharged from the hospital 2 days ago for hypertensive urgency.  He states the headache is very similar to the headache he had when he was admitted.  Also endorses intermittent visual disturbance.  He states he has been taking the amlodipine  as prescribed him.  He states that his whole body has been more warm since he has been starting it.  Denies fever.  Denies chest pain or shortness of breath.  Denies nuchal rigidity or brain fog.    Headache Associated symptoms: fever   Fever Associated symptoms: headaches       Prior to Admission medications   Medication Sig Start Date End Date Taking? Authorizing Provider  amLODipine  (NORVASC ) 10 MG tablet Take 1 tablet (10 mg total) by mouth daily. 05/05/24   Rojelio Nest, DO  hydrALAZINE  (APRESOLINE ) 50 MG tablet Take 1 tablet (50 mg total) by mouth every 8 (eight) hours. 05/04/24 08/02/24  Rojelio Nest, DO  metoprolol  tartrate (LOPRESSOR ) 25 MG tablet Take 1 tablet (25 mg total) by mouth 2 (two) times daily. 05/04/24 08/02/24  Rojelio Nest, DO    Allergies: Patient has no known allergies.    Review of Systems  Constitutional:  Positive for fever.  Neurological:  Positive for headaches.    Updated Vital Signs BP (!) 157/75 (BP Location: Right Arm)   Pulse 65   Temp 98.3 F (36.8 C) (Oral)   Resp 18   Ht 5' 6 (1.676 m)   Wt 96 kg   SpO2 98%   BMI 34.16 kg/m   Physical Exam Vitals and nursing note reviewed.  HENT:     Head: Normocephalic and atraumatic.     Mouth/Throat:     Mouth: Mucous membranes are moist.  Eyes:     General:        Right eye: No discharge.         Left eye: No discharge.     Conjunctiva/sclera: Conjunctivae normal.  Cardiovascular:     Rate and Rhythm: Normal rate and regular rhythm.     Pulses: Normal pulses.     Heart sounds: Normal heart sounds.  Pulmonary:     Effort: Pulmonary effort is normal.     Breath sounds: Normal breath sounds.  Abdominal:     General: Abdomen is flat.     Palpations: Abdomen is soft.  Skin:    General: Skin is warm and dry.  Neurological:     General: No focal deficit present.  Psychiatric:        Mood and Affect: Mood normal.     (all labs ordered are listed, but only abnormal results are displayed) Labs Reviewed  LIPASE, BLOOD - Abnormal; Notable for the following components:      Result Value   Lipase 53 (*)    All other components within normal limits  COMPREHENSIVE METABOLIC PANEL WITH GFR - Abnormal; Notable for the following components:   Sodium 134 (*)    Glucose, Bld 137 (*)    BUN 33 (*)    Creatinine, Ser 2.55 (*)    AST 46 (*)    ALT 65 (*)  GFR, Estimated 29 (*)    All other components within normal limits  CBC - Abnormal; Notable for the following components:   RBC 4.04 (*)    Hemoglobin 12.0 (*)    HCT 37.2 (*)    All other components within normal limits  URINALYSIS, ROUTINE W REFLEX MICROSCOPIC - Abnormal; Notable for the following components:   Color, Urine STRAW (*)    Protein, ur 100 (*)    All other components within normal limits    EKG: None  Radiology: CT Head Wo Contrast Result Date: 05/06/2024 CLINICAL DATA:  Neuro deficit and headache.  Fever. EXAM: CT HEAD WITHOUT CONTRAST TECHNIQUE: Contiguous axial images were obtained from the base of the skull through the vertex without intravenous contrast. RADIATION DOSE REDUCTION: This exam was performed according to the departmental dose-optimization program which includes automated exposure control, adjustment of the mA and/or kV according to patient size and/or use of iterative reconstruction technique.  COMPARISON:  CT head 05/01/2024 and MRI head 05/03/2024 FINDINGS: Brain: No intracranial hemorrhage, mass effect, or evidence of acute infarct. No hydrocephalus. No extra-axial fluid collection. 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 Vascular: No hyperdense vessel or unexpected calcification. Skull: No fracture or focal lesion. Sinuses/Orbits: No acute finding. Other: None. IMPRESSION: No acute intracranial abnormality. Electronically Signed   By: Norman Gatlin M.D.   On: 05/06/2024 21:46     Procedures   Medications Ordered in the ED  metoCLOPramide  (REGLAN ) injection 10 mg (10 mg Intravenous Given 05/06/24 2118)  diphenhydrAMINE  (BENADRYL ) injection 12.5 mg (12.5 mg Intravenous Given 05/06/24 2118)  acetaminophen  (TYLENOL ) tablet 1,000 mg (1,000 mg Oral Given 05/06/24 2117)                                    Medical Decision Making Amount and/or Complexity of Data Reviewed Radiology: ordered.  Risk OTC drugs. Prescription drug management.   Initial Impression and Ddx 58 year old well-appearing male present for headache.  Exam was unremarkable.  DDx includes hypertensive emergency, stroke, meningitis, other. Patient PMH that increases complexity of ED encounter:   DM2, CKD stage III, hypertension, CHF  Interpretation of Diagnostics - I independent reviewed and interpreted the labs as followed: AST/ALT mildly elevated, lipase mildly elevated  - I independently visualized the following imaging with scope of interpretation limited to determining acute life threatening conditions related to emergency care: CT, which revealed no acute findings  Patient Reassessment and Ultimate Disposition/Management On reassessment, headache had improved.  Workup overall reassuring.  Does not suggest hypertensive emergency, stroke.  Also considered meningitis but I am likely given lack of symptoms.  BP was slightly elevated but has improved since his initial encounter last week where it was in  the 200s systolically.  Advised him to continue taking his amlodipine .  Discussed return precautions.  Advised to follow-up with PCP.  Discharged good condition.  Patient management required discussion with the following services or consulting groups:  None  Complexity of Problems Addressed Acute complicated illness or Injury  Additional Data Reviewed and Analyzed Further history obtained from: Past medical history and medications listed in the EMR and Prior ED visit notes  Patient Encounter Risk Assessment Consideration of hospitalization      Final diagnoses:  Nonintractable headache, unspecified chronicity pattern, unspecified headache type    ED Discharge Orders     None          Madalee Altmann K, PA-C 05/06/24  2152    Ruthe Cornet, DO 05/06/24 2307

## 2024-05-06 NOTE — ED Triage Notes (Addendum)
 Pt to ED c/o HA x 4 days, recently discharged from hospital for the same.  Pt also reports fever

## 2024-05-06 NOTE — Discharge Instructions (Signed)
 Evaluation today was overall reassuring.  Please follow-up with your PCP.  If your symptoms worsen anyway as please return to the ED for further evaluation.  Recommend that you continue taking the amlodipine  as prescribed to you.

## 2024-05-06 NOTE — ED Triage Notes (Signed)
 Thew pt is c/o a headache for several days with a fever

## 2024-05-07 ENCOUNTER — Other Ambulatory Visit (HOSPITAL_COMMUNITY): Payer: Self-pay

## 2024-05-18 ENCOUNTER — Ambulatory Visit: Payer: Self-pay

## 2024-06-03 ENCOUNTER — Other Ambulatory Visit: Payer: Self-pay

## 2024-06-03 ENCOUNTER — Ambulatory Visit: Payer: Self-pay

## 2024-06-03 VITALS — BP 189/92 | HR 60 | Temp 98.3°F | Resp 16 | Ht 65.0 in | Wt 225.6 lb

## 2024-06-03 DIAGNOSIS — E1122 Type 2 diabetes mellitus with diabetic chronic kidney disease: Secondary | ICD-10-CM

## 2024-06-03 DIAGNOSIS — I129 Hypertensive chronic kidney disease with stage 1 through stage 4 chronic kidney disease, or unspecified chronic kidney disease: Secondary | ICD-10-CM

## 2024-06-03 DIAGNOSIS — I10 Essential (primary) hypertension: Secondary | ICD-10-CM

## 2024-06-03 DIAGNOSIS — Z7689 Persons encountering health services in other specified circumstances: Secondary | ICD-10-CM

## 2024-06-03 DIAGNOSIS — N1832 Chronic kidney disease, stage 3b: Secondary | ICD-10-CM

## 2024-06-03 LAB — POCT GLYCOSYLATED HEMOGLOBIN (HGB A1C): Hemoglobin A1C: 6.6 % — AB (ref 4.0–5.6)

## 2024-06-03 LAB — POCT CBG (FASTING - GLUCOSE)-MANUAL ENTRY: Glucose Fasting, POC: 122 mg/dL — AB (ref 70–99)

## 2024-06-03 MED ORDER — AMLODIPINE BESYLATE 10 MG PO TABS
10.0000 mg | ORAL_TABLET | Freq: Every day | ORAL | 2 refills | Status: AC
  Filled 2024-06-03: qty 90, 90d supply, fill #0

## 2024-06-03 MED ORDER — METOPROLOL TARTRATE 25 MG PO TABS
25.0000 mg | ORAL_TABLET | Freq: Two times a day (BID) | ORAL | 2 refills | Status: AC
  Filled 2024-06-03: qty 90, 45d supply, fill #0

## 2024-06-03 MED ORDER — LISINOPRIL 10 MG PO TABS
10.0000 mg | ORAL_TABLET | Freq: Every day | ORAL | 2 refills | Status: AC
Start: 2024-06-03 — End: 2024-09-01
  Filled 2024-06-03: qty 90, 90d supply, fill #0

## 2024-06-03 NOTE — Progress Notes (Signed)
 Patient ID: Scott Byrd, male    DOB: [birth year]  MRN: [id number]  CC: Establish Care and Medical Management of Chronic Issues (Patient is here to established care with provider./~health hx address/~care gaps address/ )   Subjective: Javione Gunawan is a 58 y.o. male with past medical history of hypertension, diabetes who presents to clinic to establish care. Reports he did not take his blood pressure medication prior to appointment. Pt reports occasional leg swelling in the evenings after work days. Denies chest pain, headache, changes in vision or shortness of breath. Pt lives in [place] but wants to continue to be seen at this office.   Patient Active Problem List   Diagnosis Date Noted   CKD stage 3b, GFR 30-44 ml/min (HCC) 05/04/2024   Chronic diastolic CHF (congestive heart failure) (HCC) 05/04/2024   Hypertensive urgency 05/01/2024   Dizziness 05/01/2024   Acute kidney injury superimposed on chronic kidney disease (HCC) 05/24/2022   Hypokalemia 05/24/2022   Elevated blood-pressure reading, without diagnosis of hypertension 05/24/2022   Type 2 diabetes mellitus (HCC) 05/24/2022   Nicotine dependence, cigarettes, uncomplicated 05/24/2022     Current Outpatient Medications on File Prior to Visit  Medication Sig Dispense Refill   hydrALAZINE  (APRESOLINE ) 50 MG tablet Take 1 tablet (50 mg total) by mouth every 8 (eight) hours. 90 tablet 0   No current facility-administered medications on file prior to visit.    No Known Allergies  Social History   Socioeconomic History   Marital status: Married    Spouse name: Not on file   Number of children: Not on file   Years of education: Not on file   Highest education level: Not on file  Occupational History   Not on file  Tobacco Use   Smoking status: Former    Current packs/day: 0.00    Types: Cigarettes    Quit date: 10/08/2011    Years since quitting: 12.6   Smokeless tobacco: Not on file  Vaping  Use   Vaping status: Never Used  Substance and Sexual Activity   Alcohol use: Yes    Comment: 1 beer nightly   Drug use: No   Sexual activity: Yes    Birth control/protection: None  Other Topics Concern   Not on file  Social History Narrative   Lives alone   Social Drivers of Health   Financial Resource Strain: Not on file  Food Insecurity: No Food Insecurity (05/02/2024)   Hunger Vital Sign    Worried About Running Out of Food in the Last Year: Never true    Ran Out of Food in the Last Year: Never true  Transportation Needs: No Transportation Needs (05/02/2024)   PRAPARE - Administrator, Civil Service (Medical): No    Lack of Transportation (Non-Medical): No  Physical Activity: Not on file  Stress: Not on file  Social Connections: Not on file  Intimate Partner Violence: Not At Risk (05/02/2024)   Humiliation, Afraid, Rape, and Kick questionnaire    Fear of Current or Ex-Partner: No    Emotionally Abused: No    Physically Abused: No    Sexually Abused: No    History reviewed. No pertinent family history.  History reviewed. No pertinent surgical history.  ROS: Review of Systems Negative except as stated above  PHYSICAL EXAM: BP (!) 189/92   Pulse 60   Temp 98.3 F (36.8 C) (Oral)   Resp 16   Ht 5' 5 (1.651 m)   Wt  225 lb 9.6 oz (102.3 kg)   SpO2 96%   BMI 37.54 kg/m   Physical Exam  General: well-appearing, no acute distress Skin: no jaundice, rashes, or lesions Cardiovascular: regular heart rate and rhythm, normal S1/S2, no murmurs, gallops, or rubs, peripheral pulses 2+ bilaterally Chest: no skeletal deformity, lungs clear to auscultation bilaterally, equal breath sounds bilaterally Abdomen: soft, non-distended, non-tender to palpation, no hepatomegaly, no splenomegaly, normoactive bowel sounds. Reducible umbilical hernia Musculoskeletal: normal gait Extremities: no peripheral edema     ASSESSMENT AND PLAN:  1. Type 2 diabetes mellitus  with stage 3b chronic kidney disease, without long-term current use of insulin  (HCC) (Primary) - A1C is 6.6 which is at goal. Pt encouraged to continue diet adjustments - Urine Albumin/Creatinine with ratio (send out) [LAB689] - Start lisinopril  (ZESTRIL ) 10 MG tablet; Take 1 tablet (10 mg total) by mouth daily.  Dispense: 90 tablet; Refill: 2  2. CKD stage 3b, GFR 30-44 ml/min (HCC) - Adding Lisinopril  10mg  tablet once daily for its kidney protective properties.   3. Encounter to establish care with new provider  4. Primary hypertension - Elevated blood pressure reading in office today at 189/92. Not at goal. Denies any red flag symptoms. Pt did not take his medications prior to visit.  - Continue amLODipine  (NORVASC ) 10 MG tablet; Take 1 tablet (10 mg total) by mouth daily.  Dispense: 90 tablet; Refill: 2 - Continue metoprolol  tartrate (LOPRESSOR ) 25 MG tablet; Take 1 tablet (25 mg total) by mouth 2 (two) times daily.  Dispense: 90 tablet; Refill: 2 - Start lisinopril  (ZESTRIL ) 10 MG tablet; Take 1 tablet (10 mg total) by mouth daily.  Dispense: 90 tablet; Refill: 2    Patient was given the opportunity to ask questions.  Patient verbalized understanding of the plan and was able to repeat key elements of the plan. Patient was given clear instructions to go to Emergency Department or return to medical center if symptoms don't improve, worsen, or new problems develop.The patient verbalized understanding.   Orders Placed This Encounter  Procedures   Urine Albumin/Creatinine with ratio (send out) [LAB689]   POCT glycosylated hemoglobin (Hb A1C)   Glucose (CBG), Fasting     Requested Prescriptions   Signed Prescriptions Disp Refills   amLODipine  (NORVASC ) 10 MG tablet 90 tablet 2    Sig: Take 1 tablet (10 mg total) by mouth daily.   metoprolol  tartrate (LOPRESSOR ) 25 MG tablet 90 tablet 2    Sig: Take 1 tablet (25 mg total) by mouth 2 (two) times daily.   lisinopril  (ZESTRIL ) 10 MG  tablet 90 tablet 2    Sig: Take 1 tablet (10 mg total) by mouth daily.    Return in about 3 weeks (around 06/24/2024).  [name], PA-C

## 2024-06-05 LAB — MICROALBUMIN / CREATININE URINE RATIO
Creatinine, Urine: 89.5 mg/dL
Microalb/Creat Ratio: 1222 mg/g{creat} — ABNORMAL HIGH (ref 0–29)
Microalbumin, Urine: 1093.4 ug/mL

## 2024-06-24 ENCOUNTER — Ambulatory Visit (INDEPENDENT_AMBULATORY_CARE_PROVIDER_SITE_OTHER): Payer: Self-pay

## 2024-06-24 VITALS — BP 127/79 | HR 61 | Temp 98.2°F | Resp 16 | Wt 223.0 lb

## 2024-06-24 DIAGNOSIS — M7989 Other specified soft tissue disorders: Secondary | ICD-10-CM

## 2024-06-24 DIAGNOSIS — M722 Plantar fascial fibromatosis: Secondary | ICD-10-CM

## 2024-06-24 DIAGNOSIS — I10 Essential (primary) hypertension: Secondary | ICD-10-CM

## 2024-06-24 NOTE — Progress Notes (Signed)
 Patient ID: Scott Byrd, male    DOB: Mar 06, 1966  MRN: [id number]  CC: Medical Management of Chronic Issues (Follow-up DM/Patient still c/o legs swelling and feet)   Subjective: Scott Byrd is a 58 y.o. male with past medical history of hypertension, CKD who presents to clinic for follow up. Reporting continued lower extremity swelling, worse at the end of the work day. Overall feeling better since last visit. Planning to transfer care to clinic closer to home in [place]. Taking all medications as directed.    Patient Active Problem List   Diagnosis Date Noted   CKD stage 3b, GFR 30-44 ml/min (HCC) 05/04/2024   Chronic diastolic CHF (congestive heart failure) (HCC) 05/04/2024   Hypertensive urgency 05/01/2024   Dizziness 05/01/2024   Acute kidney injury superimposed on chronic kidney disease (HCC) 05/24/2022   Hypokalemia 05/24/2022   Elevated blood-pressure reading, without diagnosis of hypertension 05/24/2022   Type 2 diabetes mellitus (HCC) 05/24/2022   Nicotine dependence, cigarettes, uncomplicated 05/24/2022     Current Outpatient Medications on File Prior to Visit  Medication Sig Dispense Refill   amLODipine  (NORVASC ) 10 MG tablet Take 1 tablet (10 mg total) by mouth daily. 90 tablet 2   hydrALAZINE  (APRESOLINE ) 50 MG tablet Take 1 tablet (50 mg total) by mouth every 8 (eight) hours. 90 tablet 0   lisinopril  (ZESTRIL ) 10 MG tablet Take 1 tablet (10 mg total) by mouth daily. 90 tablet 2   metoprolol  tartrate (LOPRESSOR ) 25 MG tablet Take 1 tablet (25 mg total) by mouth 2 (two) times daily. 90 tablet 2   No current facility-administered medications on file prior to visit.    No Known Allergies  Social History   Socioeconomic History   Marital status: Married    Spouse name: Not on file   Number of children: Not on file   Years of education: Not on file   Highest education level: Not on file  Occupational History   Not on file  Tobacco Use   Smoking  status: Former    Current packs/day: 0.00    Types: Cigarettes    Quit date: 10/08/2011    Years since quitting: 12.7   Smokeless tobacco: Not on file  Vaping Use   Vaping status: Never Used  Substance and Sexual Activity   Alcohol use: Yes    Comment: 1 beer nightly   Drug use: No   Sexual activity: Yes    Birth control/protection: None  Other Topics Concern   Not on file  Social History Narrative   Lives alone   Social Drivers of Health   Financial Resource Strain: Low Risk  (06/24/2024)   Overall Financial Resource Strain (CARDIA)    Difficulty of Paying Living Expenses: Not very hard  Food Insecurity: No Food Insecurity (05/02/2024)   Hunger Vital Sign    Worried About Running Out of Food in the Last Year: Never true    Ran Out of Food in the Last Year: Never true  Transportation Needs: No Transportation Needs (05/02/2024)   PRAPARE - Administrator, Civil Service (Medical): No    Lack of Transportation (Non-Medical): No  Physical Activity: Inactive (06/24/2024)   Exercise Vital Sign    Days of Exercise per Week: 0 days    Minutes of Exercise per Session: 0 min  Stress: Not on file  Social Connections: Moderately Integrated (06/24/2024)   Social Connection and Isolation Panel    Frequency of Communication with Friends and Family:  Twice a week    Frequency of Social Gatherings with Friends and Family: Twice a week    Attends Religious Services: 1 to 4 times per year    Active Member of [employer] or Organizations: No    Attends Banker Meetings: Never    Marital Status: Married  Catering manager Violence: Not At Risk (05/02/2024)   Humiliation, Afraid, Rape, and Kick questionnaire    Fear of Current or Ex-Partner: No    Emotionally Abused: No    Physically Abused: No    Sexually Abused: No    No family history on file.  No past surgical history on file.  ROS: Review of Systems Negative except as stated above  PHYSICAL EXAM: BP 127/79 (Cuff  Size: Large)   Pulse 61   Temp 98.2 F (36.8 C) (Oral)   Resp 16   Wt 223 lb (101.2 kg)   SpO2 94%   BMI 37.11 kg/m   Physical Exam  General: well-appearing, no acute distress Skin: no jaundice, rashes, or lesions Cardiovascular: regular heart rate and rhythm, normal S1/S2, no murmurs, gallops, or rubs, peripheral pulses 2+ bilaterally Chest: no skeletal deformity, lungs clear to auscultation bilaterally, equal breath sounds bilaterally Abdomen: soft, non-distended, non-tender to palpation, no hepatomegaly, no splenomegaly, normoactive bowel sounds Musculoskeletal: normal gait. Pain to palpation along bilateral heel cord and calcaneous.  Extremities: mild pitting edema bilateral tibias    ASSESSMENT AND PLAN:  1. Primary hypertension (Primary) BP in office initially elevated but last reading was 127/79 which is at goal.  - Continue Amlodipine  10mg  tablet daily - Continue Metoprolol  25mg  tablet daily - Increase Lisinopril  to 20mg  daily. Pt instructed to take 2 of 10mg  tablets daily.   2. Plantar fasciitis, bilateral - Recommended shoe inserts to decrease pain. Recommended ice application and massage for symptom relief.  3. Leg swelling - Recommended compression stockings and elevating legs to assist with venous return. Pt has CKD which is likely contributing to fluid build up.  - Consider decreasing amlodipine  next visit if symptoms continue, due to its vasodilating effects that may potentially be contributing to edema.     Patient was given the opportunity to ask questions.  Patient verbalized understanding of the plan and was able to repeat key elements of the plan.     Patient lives in [place], will try to establish care closer to home.  No follow-ups on file.  Scott Leavy Rode, PA-C
